# Patient Record
Sex: Female | Born: 1995
Health system: Southern US, Community
[De-identification: ages and names within clinical notes are randomized; demographics above are authoritative.]

## PROBLEM LIST (undated history)

## (undated) DIAGNOSIS — F411 Generalized anxiety disorder: Secondary | ICD-10-CM

## (undated) DIAGNOSIS — IMO0001 Reserved for inherently not codable concepts without codable children: Secondary | ICD-10-CM

## (undated) DIAGNOSIS — R030 Elevated blood-pressure reading, without diagnosis of hypertension: Principal | ICD-10-CM

## (undated) DIAGNOSIS — F325 Major depressive disorder, single episode, in full remission: Secondary | ICD-10-CM

## (undated) HISTORY — DX: Elevated blood-pressure reading, without diagnosis of hypertension: R03.0

## (undated) HISTORY — DX: Major depressive disorder, single episode, in full remission: F32.5

## (undated) HISTORY — DX: Generalized anxiety disorder: F41.1

## (undated) HISTORY — DX: Reserved for inherently not codable concepts without codable children: IMO0001

---
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
## 2015-02-19 ENCOUNTER — Emergency Department
Admission: EM | Admit: 2015-02-19 | Discharge: 2015-02-19 | Disposition: A | Discharge status: Home / Self Care | Payer: 59 | Source: Home / Self Care | Attending: Family Medicine | Admitting: Family Medicine

## 2015-02-19 ENCOUNTER — Encounter: Payer: Self-pay | Admitting: Emergency Medicine

## 2015-02-19 DIAGNOSIS — J029 Acute pharyngitis, unspecified: Secondary | ICD-10-CM

## 2015-02-19 LAB — POCT RAPID STREP A (OFFICE): RAPID STREP A SCREEN: NEGATIVE

## 2015-02-19 MED ORDER — PENICILLIN V POTASSIUM 500 MG PO TABS: ORAL_TABLET | ORAL | Status: DC

## 2015-02-19 NOTE — ED Provider Notes (Signed)
CSN: [id number]     Arrival date & time 02/19/15  1732 History   First MD Initiated Contact with Patient 02/19/15 1746     Chief Complaint  Patient presents with  . Sore Throat  . Fever      HPI Comments: Patient developed a sore throat five days ago that has persisted.  She has had fever as high as 100.3 last night.  No URI symptoms other than occasional sneezing.   History reviewed. No pertinent past medical history. History reviewed. No pertinent past surgical history. History reviewed. No pertinent family history. History  Substance Use Topics  . Smoking status: Never Smoker   . Smokeless tobacco: Not on file  . Alcohol Use: No   OB History    No data available     Review of Systems + sore throat No cough + sneezing No pleuritic pain No wheezing No nasal congestion ? post-nasal drainage No sinus pain/pressure No itchy/red eyes No earache No hemoptysis No SOB + fever, + chills No nausea No vomiting No abdominal pain No diarrhea No urinary symptoms No skin rash + fatigue + myalgias + headache Used OTC meds without relief  Allergies  Review of patient's allergies indicates no known allergies.  Home Medications   Prior to Admission medications   Medication Sig Start Date End Date Taking? Authorizing Provider  etonogestrel (NEXPLANON) 68 MG IMPL implant 1 each by Subdermal route once.   Yes Historical Provider, MD  fluticasone (FLONASE) 50 MCG/ACT nasal spray Place 2 sprays into both nostrils daily. 02/07/14   Shon Baton, MD  penicillin v potassium (VEETID) 500 MG tablet Take one tab by mouth twice daily for 10 days 02/19/15   Lattie Haw, MD   BP 123/85 mmHg  Pulse 115  Temp(Src) 99.1 F (37.3 C) (Oral)  Resp 18  Ht  (1.626 m)  Wt 202 lb (91.627 kg)  BMI 34.66 kg/m2  SpO2 98% Physical Exam Nursing notes and Vital Signs reviewed. Appearance:  Patient appears stated age, and in no acute distress Eyes:  Pupils are equal, round, and  reactive to light and accomodation.  Extraocular movement is intact.  Conjunctivae are not inflamed  Ears:  Canals normal.  Tympanic membranes normal.  Nose:  Mildly congested turbinates.  No sinus tenderness.   Pharynx:  Erythematous and slightly swollen without obstruction. Bilateral exudate present. Neck:  Supple.  Tender enlarged anterior nodes are palpated bilaterally  Lungs:  Clear to auscultation.  Breath sounds are equal.  Moving air well. Heart:  Regular rate and rhythm without murmurs, rubs, or gallops.  Abdomen:  Nontender without masses or hepatosplenomegaly.  Bowel sounds are present.  No CVA or flank tenderness.  Extremities:  No edema.  No calf tenderness Skin:  No rash present.   ED Course  Procedures  None   Labs Reviewed  STREP A DNA PROBE  POCT RAPID STREP A (OFFICE) negative         MDM   1. Acute pharyngitis, unspecified pharyngitis type; suspect strep pharyngitis despite negative rapid strep test.    Throat culture pending Begin PenVK  BID for 10 days for presumptive strep Try warm salt water gargles for sore throat.  May take Ibuprofen , 4 tabs every 8 hours with food.  Followup with Family Doctor if not improved in 10 days.    Lattie Haw, MD 02/19/15 [id number]

## 2015-02-19 NOTE — Discharge Instructions (Signed)
Try warm salt water gargles for sore throat.  May take Ibuprofen 200mg, 4 tabs every 8 hours with food.  ° ° °Salt Water Gargle °This solution will help make your mouth and throat feel better. °HOME CARE INSTRUCTIONS  °· Mix 1 teaspoon of salt in 8 ounces of warm water. °· Gargle with this solution as much or often as you need or as directed. Swish and gargle gently if you have any sores or wounds in your mouth. °· Do not swallow this mixture. °Document Released: 04/19/2004 Document Revised: 10/08/2011 Document Reviewed: 09/10/2008 °[employer]® Patient Information ©2015 [employer]. This information is not intended to replace advice given to you by your health care provider. Make sure you discuss any questions you have with your health care provider. ° °

## 2015-02-19 NOTE — ED Notes (Signed)
Gives 5 day history of progressively worse sore throat with low grade fever; took OTC at 1030 today; sister being treated for probable Strep.

## 2015-02-21 LAB — STREP A DNA PROBE: GASP: NEGATIVE

## 2015-02-23 ENCOUNTER — Telehealth: Payer: Self-pay | Admitting: *Deleted

## 2015-09-01 DIAGNOSIS — J069 Acute upper respiratory infection, unspecified: Secondary | ICD-10-CM | POA: Diagnosis not present

## 2015-09-01 DIAGNOSIS — J0100 Acute maxillary sinusitis, unspecified: Secondary | ICD-10-CM | POA: Diagnosis not present

## 2015-11-02 DIAGNOSIS — Z0100 Encounter for examination of eyes and vision without abnormal findings: Secondary | ICD-10-CM | POA: Diagnosis not present

## 2015-11-02 DIAGNOSIS — Z0110 Encounter for examination of ears and hearing without abnormal findings: Secondary | ICD-10-CM | POA: Diagnosis not present

## 2015-11-02 DIAGNOSIS — F411 Generalized anxiety disorder: Secondary | ICD-10-CM | POA: Diagnosis not present

## 2015-11-02 DIAGNOSIS — Z0000 Encounter for general adult medical examination without abnormal findings: Secondary | ICD-10-CM | POA: Diagnosis not present

## 2016-01-09 ENCOUNTER — Encounter: Payer: Self-pay | Admitting: Family Medicine

## 2016-01-09 ENCOUNTER — Ambulatory Visit (INDEPENDENT_AMBULATORY_CARE_PROVIDER_SITE_OTHER): Payer: 59 | Admitting: Family Medicine

## 2016-01-09 VITALS — BP 144/79 | HR 75 | Wt 212.0 lb

## 2016-01-09 DIAGNOSIS — F411 Generalized anxiety disorder: Secondary | ICD-10-CM | POA: Diagnosis not present

## 2016-01-09 HISTORY — DX: Generalized anxiety disorder: F41.1

## 2016-01-09 MED ORDER — SERTRALINE HCL 25 MG PO TABS: 25.0000 mg | ORAL_TABLET | Freq: Every day | ORAL | Status: DC

## 2016-01-09 NOTE — Progress Notes (Signed)
       Barbara Hooper is a 20 y.o. female who [name] [facility] today for establish care and discuss anxiety. Patient has had difficulty with anxiety for her entire life. However recently her anxiety has worsened. 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 She notes worsening anxiety and difficulty falling asleep due to rumination thoughts. She's failed some classes because of her anxiety issues. She's never had medications for this problem before. She notes a family history significant for anxiety. She denies any personal or family history for bipolar disorder.   History reviewed. No pertinent past medical history. History reviewed. No pertinent past surgical history. Social History  Substance Use Topics  . Smoking status: Never Smoker   . Smokeless tobacco: Not on file  . Alcohol Use: No   family history includes Cancer in her maternal aunt and maternal uncle; Depression in her maternal grandmother; Hypertension in her father and mother.  ROS as above: No headache, visual changes, nausea, vomiting, diarrhea, constipation, dizziness, abdominal pain, skin rash, fevers, chills, night sweats, weight loss, swollen lymph nodes, body aches, joint swelling, muscle aches, chest pain, shortness of breath, , visual or auditory hallucinations.    Medications: Current Outpatient Prescriptions  Medication Sig Dispense Refill  . etonogestrel (NEXPLANON) 68 MG IMPL implant 1 each by Subdermal route once.    . sertraline (ZOLOFT) 25 MG tablet Take 1 tablet (25 mg total) by mouth daily. 30 tablet 0   No current facility-administered medications for this visit.   No Known Allergies   Exam:  BP 144/79 mmHg  Pulse 75  Wt 212 lb (96.163 kg) Gen: Well NAD HEENT: EOMI,  MMM Lungs: Normal work of breathing. CTABL Heart: RRR no  MRG Abd: NABS, Soft. Nondistended, Nontender Exts: Brisk capillary refill, warm and well perfused.  Psych: Alert and oriented normal speech thought process and affect.  GAD 7 is 11 PHQ9 is 16 MDQ is positive at 9  No results found for this or any previous visit (from the past 24 hour(s)). No results found.    Assessment and Plan: 20 y.o. female with  Anxiety disorder. Likely generalized anxiety disorder. Doubtful for bipolar and she denies any significant manic symptoms. When asked in more detail most of the positive mood disorder questionnaire are more related to anxiety and less than mania. Additionally she denies any family history for bipolar disorder. Plan for trial of Zoloft and recheck in 2 weeks. Additionally refer to therapy/counseling.  Discussed warning signs or symptoms. Please see discharge instructions. Patient expresses understanding.

## 2016-01-09 NOTE — Patient Instructions (Signed)
Thank you for coming in today. Start 25 mg of Zoloft daily. After 1 week increase to 50 mg (2 pills) daily. Return for recheck in about 2 weeks. You should hear from psychology counseling soon/   Generalized Anxiety Disorder Generalized anxiety disorder (GAD) is a mental disorder. It interferes with life functions, including relationships, work, and school. GAD is different from normal anxiety, which everyone experiences at some point in their lives in response to specific life events and activities. Normal anxiety actually helps us prepare for and get through these life events and activities. Normal anxiety goes away after the event or activity is over.  GAD causes anxiety that is not necessarily related to specific events or activities. It also causes excess anxiety in proportion to specific events or activities. The anxiety associated with GAD is also difficult to control. GAD can vary from mild to severe. People with severe GAD can have intense waves of anxiety with physical symptoms (panic attacks).  SYMPTOMS The anxiety and worry associated with GAD are difficult to control. This anxiety and worry are related to many life events and activities and also occur more days than not for 6 months or longer. People with GAD also have three or more of the following symptoms (one or more in children):  Restlessness.   Fatigue.  Difficulty concentrating.   Irritability.  Muscle tension.  Difficulty sleeping or unsatisfying sleep. DIAGNOSIS GAD is diagnosed through an assessment by your health care provider. Your health care provider will ask you questions aboutyour mood,physical symptoms, and events in your life. Your health care provider may ask you about your medical history and use of alcohol or drugs, including prescription medicines. Your health care provider may also do a physical exam and blood tests. Certain medical conditions and the use of certain substances can cause symptoms  similar to those associated with GAD. Your health care provider may refer you to a mental health specialist for further evaluation. TREATMENT The following therapies are usually used to treat GAD:   Medication. Antidepressant medication usually is prescribed for long-term daily control. Antianxiety medicines may be added in severe cases, especially when panic attacks occur.   Talk therapy (psychotherapy). Certain types of talk therapy can be helpful in treating GAD by providing support, education, and guidance. A form of talk therapy called cognitive behavioral therapy can teach you healthy ways to think about and react to daily life events and activities.  Stress managementtechniques. These include yoga, meditation, and exercise and can be very helpful when they are practiced regularly. A mental health specialist can help determine which treatment is best for you. Some people see improvement with one therapy. However, other people require a combination of therapies.   This information is not intended to replace advice given to you by your health care provider. Make sure you discuss any questions you have with your health care provider.   Document Released: 11/10/2012 Document Revised: 08/06/2014 Document Reviewed: 11/10/2012 Elsevier Interactive Patient Education [employer]2016 [employer] Inc.

## 2016-01-19 ENCOUNTER — Encounter: Payer: Self-pay | Admitting: Family Medicine

## 2016-01-23 ENCOUNTER — Ambulatory Visit (INDEPENDENT_AMBULATORY_CARE_PROVIDER_SITE_OTHER): Payer: 59 | Admitting: Psychology

## 2016-01-23 ENCOUNTER — Ambulatory Visit (INDEPENDENT_AMBULATORY_CARE_PROVIDER_SITE_OTHER): Payer: 59 | Admitting: Family Medicine

## 2016-01-23 ENCOUNTER — Encounter: Payer: Self-pay | Admitting: Family Medicine

## 2016-01-23 VITALS — BP 148/70 | HR 73 | Wt 212.0 lb

## 2016-01-23 DIAGNOSIS — F411 Generalized anxiety disorder: Secondary | ICD-10-CM

## 2016-01-23 DIAGNOSIS — R030 Elevated blood-pressure reading, without diagnosis of hypertension: Secondary | ICD-10-CM

## 2016-01-23 DIAGNOSIS — IMO0001 Reserved for inherently not codable concepts without codable children: Secondary | ICD-10-CM

## 2016-01-23 MED ORDER — SERTRALINE HCL 100 MG PO TABS: 100.0000 mg | ORAL_TABLET | Freq: Every day | ORAL | Status: DC

## 2016-01-23 MED FILL — SERTRALINE HCL 100 MG TAB: 100 | 90 days supply | Qty: 90 | Fill #0

## 2016-01-23 NOTE — Progress Notes (Signed)
       Barbara Hooper is a 20 y.o. female who [facility]: Primary Care Sports Medicine today for follow-up anxiety and depression. Patient was seen 2 weeks ago diagnosed with anxiety and depression and started on Zoloft. She started at 25 mg daily increasing to 50 mg daily last week. She feels much better and is sleeping much better. No significant GI upset. No fevers or chills. She tolerates the medication well. Patient has started counseling as well. She had her first session today and felt it went well.   No past medical history on file. No past surgical history on file. Social History  Substance Use Topics  . Smoking status: Never Smoker   . Smokeless tobacco: Not on file  . Alcohol Use: No   family history includes Cancer in her maternal aunt and maternal uncle; Depression in her maternal grandmother; Hypertension in her father and mother.  ROS as above:  Medications: Current Outpatient Prescriptions  Medication Sig Dispense Refill  . etonogestrel (NEXPLANON) 68 MG IMPL implant 1 each by Subdermal route once.    . sertraline (ZOLOFT) 100 MG tablet Take 1 tablet (100 mg total) by mouth daily. 90 tablet 0   No current facility-administered medications for this visit.   No Known Allergies   Exam:  BP 148/70 mmHg  Pulse 73  Wt 212 lb (96.163 kg) Gen: Well NAD HEENT: EOMI,  MMM Lungs: Normal work of breathing. CTABL Heart: RRR no MRG Abd: NABS, Soft. Nondistended, Nontender Exts: Brisk capillary refill, warm and well perfused.  Psych: Alert and oriented normal speech thought process and affect.  [facility] 01/23/2016  Decreased Interest 0  Down, Depressed, Hopeless 1  PHQ - 2 Score 1  Altered sleeping 0  Tired, decreased energy 1  Change in appetite 0  Feeling bad or failure about yourself  1  Trouble concentrating 1  Moving slowly or fidgety/restless 0    Suicidal thoughts 0  PHQ-9 Score 4  Difficult doing work/chores Somewhat difficult    GAD 7 is 6  No results found for this or any previous visit (from the past 24 hour(s)). No results found.    Assessment and Plan: 20 y.o. female with  1) depression: Much improved on Zoloft. Increase to 100 mg daily. Recheck in one month. Continue counseling/therapy. 2) elevated blood pressure: New Issue addressed today Continues to be mildly elevated. Recheck in 1 month if still elevated will start medication. Obtain fasting labs today.  Discussed warning signs or symptoms. Please see discharge instructions. Patient expresses understanding.

## 2016-01-23 NOTE — Patient Instructions (Signed)
Thank you for coming in today. Increase Zoloft to 100 mg daily Get fasting labs soon Return in one month

## 2016-01-30 ENCOUNTER — Ambulatory Visit (INDEPENDENT_AMBULATORY_CARE_PROVIDER_SITE_OTHER): Payer: 59 | Admitting: Psychology

## 2016-01-30 DIAGNOSIS — F411 Generalized anxiety disorder: Secondary | ICD-10-CM

## 2016-02-13 DIAGNOSIS — R030 Elevated blood-pressure reading, without diagnosis of hypertension: Secondary | ICD-10-CM | POA: Diagnosis not present

## 2016-02-14 LAB — TSH: TSH: 2.79 mIU/L

## 2016-02-14 LAB — COMPREHENSIVE METABOLIC PANEL
ALK PHOS: 102 U/L (ref 33–115)
ALT: 19 U/L (ref 6–29)
AST: 19 U/L (ref 10–30)
Albumin: 4.4 g/dL (ref 3.6–5.1)
BILIRUBIN TOTAL: 0.6 mg/dL (ref 0.2–1.2)
BUN: 8 mg/dL (ref 7–25)
CALCIUM: 9.4 mg/dL (ref 8.6–10.2)
CO2: 27 mmol/L (ref 20–31)
Chloride: 103 mmol/L (ref 98–110)
Creat: 0.59 mg/dL (ref 0.50–1.10)
Glucose, Bld: 85 mg/dL (ref 65–99)
POTASSIUM: 4.2 mmol/L (ref 3.5–5.3)
Sodium: 140 mmol/L (ref 135–146)
TOTAL PROTEIN: 7 g/dL (ref 6.1–8.1)

## 2016-02-14 LAB — CBC
HEMATOCRIT: 41.4 % (ref 35.0–45.0)
HEMOGLOBIN: 13.5 g/dL (ref 11.7–15.5)
MCH: 28.8 pg (ref 27.0–33.0)
MCHC: 32.6 g/dL (ref 32.0–36.0)
MCV: 88.3 fL (ref 80.0–100.0)
MPV: 12.5 fL (ref 7.5–12.5)
Platelets: 229 10*3/uL (ref 140–400)
RBC: 4.69 MIL/uL (ref 3.80–5.10)
RDW: 13.6 % (ref 11.0–15.0)
WBC: 7.4 10*3/uL (ref 3.8–10.8)

## 2016-02-14 LAB — LIPID PANEL
CHOLESTEROL: 193 mg/dL — AB (ref 125–170)
HDL: 53 mg/dL (ref >=46)
LDL CALC: 128 mg/dL — AB (ref <110)
Total CHOL/HDL Ratio: 3.6 Ratio (ref <=5.0)
Triglycerides: 59 mg/dL (ref <150)
VLDL: 12 mg/dL (ref <30)

## 2016-02-14 LAB — HEMOGLOBIN A1C
HEMOGLOBIN A1C: 5.4 % (ref <5.7)
Mean Plasma Glucose: 108 mg/dL

## 2016-02-14 NOTE — Progress Notes (Signed)
Quick Note:  Labs look ok ______ 

## 2016-02-17 ENCOUNTER — Ambulatory Visit (INDEPENDENT_AMBULATORY_CARE_PROVIDER_SITE_OTHER): Payer: 59 | Admitting: Psychology

## 2016-02-17 DIAGNOSIS — F411 Generalized anxiety disorder: Secondary | ICD-10-CM | POA: Diagnosis not present

## 2016-02-20 ENCOUNTER — Encounter: Payer: Self-pay | Admitting: Family Medicine

## 2016-02-20 ENCOUNTER — Ambulatory Visit (INDEPENDENT_AMBULATORY_CARE_PROVIDER_SITE_OTHER): Payer: 59 | Admitting: Family Medicine

## 2016-02-20 VITALS — BP 136/83 | HR 76 | Wt 210.0 lb

## 2016-02-20 DIAGNOSIS — F411 Generalized anxiety disorder: Secondary | ICD-10-CM

## 2016-02-20 DIAGNOSIS — R030 Elevated blood-pressure reading, without diagnosis of hypertension: Secondary | ICD-10-CM | POA: Diagnosis not present

## 2016-02-20 DIAGNOSIS — IMO0001 Reserved for inherently not codable concepts without codable children: Secondary | ICD-10-CM

## 2016-02-20 NOTE — Progress Notes (Signed)
       Barbara Hooper is a 20 y.o. female who presents to [facility]: [facility] today for follow-up anxiety depression and hypertension.  Patient was seen a few weeks ago for anxiety and depression. She had improved on Zoloft. She notes she is taking 100 mg daily and feels well. She denies any SI or HI. She still has some anxiety symptoms but is receiving counseling and notes these are improving. She notes some difficulty sleeping and started taking Zoloft in the morning which seems to help.  Hypertension: Patient has had a history of elevated blood pressures. She notes she gets quite anxious to the doctor's office. No chest pain palpitations shortness of breath.   No past medical history on file. No past surgical history on file. Social History  Substance Use Topics  . Smoking status: Never Smoker  . Smokeless tobacco: Not on file  . Alcohol use No   family history includes Cancer in her maternal aunt and maternal uncle; Depression in her maternal grandmother; Hypertension in her father and mother.  ROS as above:  Medications: Current Outpatient Prescriptions  Medication Sig Dispense Refill  . etonogestrel (NEXPLANON) 68 MG IMPL implant 1 each by Subdermal route once.    . sertraline (ZOLOFT) 100 MG tablet Take 1 tablet (100 mg total) by mouth daily. 90 tablet 0   No current facility-administered medications for this visit.    No Known Allergies   Exam:  BP 136/83   Pulse 76   Wt 210 lb (95.3 kg)   BMI 36.05 kg/m  Gen: Well NAD HEENT: EOMI,  MMM Lungs: Normal work of breathing. CTABL Heart: RRR no MRG Abd: NABS, Soft. Nondistended, Nontender Exts: Brisk capillary refill, warm and well perfused.   Depression screen [facility] 2/9 02/20/2016 01/23/2016  Decreased Interest 1 0  Down, Depressed, Hopeless 0 1  PHQ - 2 Score 1 1  Altered sleeping 2 0  Tired, decreased  energy 0 1  Change in appetite 1 0  Feeling bad or failure about yourself  1 1  Trouble concentrating 2 1  Moving slowly or fidgety/restless 0 0  Suicidal thoughts 0 0  PHQ-9 Score 7 4  Difficult doing work/chores - Somewhat difficult    GAD 7 : Generalized Anxiety Score 02/20/2016  Nervous, Anxious, on Edge 1  Control/stop worrying 1  Worry too much - different things 2  Trouble relaxing 1  Restless 1  Easily annoyed or irritable 1  Afraid - awful might happen 0  Total GAD 7 Score 7  Anxiety Difficulty Somewhat difficult      No results found for this or any previous visit (from the past 24 hour(s)). No results found.    Assessment and Plan: 20 y.o. female with improving anxiety and depression symptoms. Continue Zoloft and counseling. Recheck in 2 months  Elevated blood pressure: Still high today. Patient will start checking home blood pressures keep a log and return in 2 months with a blood pressure log and her device.  Discussed warning signs or symptoms. Please see discharge instructions. Patient expresses understanding.

## 2016-02-20 NOTE — Patient Instructions (Signed)
Thank you for coming in today. Return in 2 months.  Get an over the counter blood pressure cuff and keep a log.  Bring the log and the device to a recheck visit in about 2 months.  Call for refills of zoloft.

## 2016-02-26 ENCOUNTER — Other Ambulatory Visit: Payer: Self-pay | Admitting: Family Medicine

## 2016-03-12 ENCOUNTER — Ambulatory Visit (INDEPENDENT_AMBULATORY_CARE_PROVIDER_SITE_OTHER): Payer: 59 | Admitting: Psychology

## 2016-03-12 DIAGNOSIS — F411 Generalized anxiety disorder: Secondary | ICD-10-CM

## 2016-03-30 ENCOUNTER — Ambulatory Visit (INDEPENDENT_AMBULATORY_CARE_PROVIDER_SITE_OTHER): Payer: 59 | Admitting: Psychology

## 2016-03-30 DIAGNOSIS — F411 Generalized anxiety disorder: Secondary | ICD-10-CM

## 2016-04-18 ENCOUNTER — Ambulatory Visit (INDEPENDENT_AMBULATORY_CARE_PROVIDER_SITE_OTHER): Payer: 59 | Admitting: Psychology

## 2016-04-18 DIAGNOSIS — F411 Generalized anxiety disorder: Secondary | ICD-10-CM | POA: Diagnosis not present

## 2016-04-23 ENCOUNTER — Ambulatory Visit (INDEPENDENT_AMBULATORY_CARE_PROVIDER_SITE_OTHER): Payer: 59 | Admitting: Family Medicine

## 2016-04-23 ENCOUNTER — Encounter: Payer: Self-pay | Admitting: Family Medicine

## 2016-04-23 VITALS — BP 120/75 | HR 76 | Wt 215.0 lb

## 2016-04-23 DIAGNOSIS — F411 Generalized anxiety disorder: Secondary | ICD-10-CM

## 2016-04-23 DIAGNOSIS — Z23 Encounter for immunization: Secondary | ICD-10-CM

## 2016-04-23 MED ORDER — BUPROPION HCL ER (XL) 150 MG PO TB24: 150.0000 mg | ORAL_TABLET | ORAL | 0 refills | Status: DC

## 2016-04-23 NOTE — Progress Notes (Signed)
Barbara Hooper is a 20 y.o. female who presents to [name] today for follow up of anxiety/depression and elevated blood pressure.  Anxiety/depression:  She has been taking Zoloft 100 mg daily and attending counseling every 2 weeks.  Patient states the counseling helps her considerably.  Her anxiety symptoms have improved, but she has noticed worsening depression since her last visit.  She endorses intermittent feelings of self harm for the past few weeks, though she has not developed a plan.  She denies current SI.  Patient reports increased fatigue throughout the day and difficulty sleeping at night.  She feels fatigued even after sleeping well at night.  Denies manic symptoms.  Elevated blood pressure:  Home blood pressure log reveals her systolic pressure ranges from 161-096E.  She has been working out more since her last visit.  No exertional chest pain, shortness of breath, or palpitations.     Past Medical History:  Diagnosis Date  . Elevated blood pressure    No past surgical history on file. Social History  Substance Use Topics  . Smoking status: Never Smoker  . Smokeless tobacco: Not on file  . Alcohol use No   family history includes Cancer in her maternal aunt and maternal uncle; Depression in her maternal grandmother; Hypertension in her father and mother.  ROS as above:  Medications: Current Outpatient Prescriptions  Medication Sig Dispense Refill  . etonogestrel (NEXPLANON) 68 MG IMPL implant 1 each by Subdermal route once.    . sertraline (ZOLOFT) 100 MG tablet Take 1 tablet (100 mg total) by mouth daily. 90 tablet 0   No current facility-administered medications for this visit.    No Known Allergies   Exam:  BP 120/75   Pulse 76   Wt 215 lb (97.5 kg)   BMI 36.90 kg/m  Gen: Well NAD Lungs: Normal work of breathing. CTABL Heart: RRR  no MRG Exts: Brisk capillary refill, warm and well perfused.  Psych: Normal mood and affect.  Normal mentation  Depression screen [facility] 2/9 04/23/2016 02/20/2016 01/23/2016  Decreased Interest 1 1 0  Down, Depressed, Hopeless 1 0 1  PHQ - 2 Score 2 1 1   Altered sleeping 3 2 0  Tired, decreased energy 2 0 1  Change in appetite 2 1 0  Feeling bad or failure about yourself  1 1 1   Trouble concentrating 1 2 1   Moving slowly or fidgety/restless 0 0 0  Suicidal thoughts 2 0 0  PHQ-9 Score 13 7 4   Difficult doing work/chores - - Somewhat difficult    GAD 7 : Generalized Anxiety Score 04/23/2016 02/20/2016  Nervous, Anxious, on Edge 2 1  Control/stop worrying 1 1  Worry too much - different things 1 2  Trouble relaxing 1 1  Restless 1 1  Easily annoyed or irritable 3 1  Afraid - awful might happen 1 0  Total GAD 7 Score 10 7  Anxiety Difficulty Somewhat difficult Somewhat difficult      No results found for this or any previous visit (from the past 24 hour(s)). No results found.    Assessment and Plan: 20 y.o. female with:  1. Anxiety/depression:  Worsening since last visit with intermittent feelings of self harm.  No current SI or HI.   - Wellbutrin 150 mg daily - Continue Zoloft 100 mg daily - Continue sleep hygiene - Follow up in 2 weeks Established problem worsened  2.  Elevated  blood pressure:  Normal blood pressure readings at home. BP 120/75 in clinic.  Her previous elevations in blood pressure taken in clinic were likely related to white coat hypertension.  We will recheck at next visit.    3.  Health maintenance:  Flu shot given   No orders of the defined types were placed in this encounter.   Discussed warning signs or symptoms. Please see discharge instructions. Patient expresses understanding.  I was present for and Independently participated in the interview and physical exam and edited and agree with the note.  Clementeen GrahamEvan Corey, MD

## 2016-04-23 NOTE — Patient Instructions (Signed)
Thank you for coming in today. Add Wellbutrin.  Return in 2 weeks.  Work on Physiological scientist.   Insomnia Insomnia is a sleep disorder that makes it difficult to fall asleep or to stay asleep. Insomnia can cause tiredness (fatigue), low energy, difficulty concentrating, mood swings, and poor performance at work or school.  There are three different ways to classify insomnia:  Difficulty falling asleep.  Difficulty staying asleep.  Waking up too early in the morning. Any type of insomnia can be long-term (chronic) or short-term (acute). Both are common. Short-term insomnia usually lasts for three months or less. Chronic insomnia occurs at least three times a week for longer than three months. CAUSES  Insomnia may be caused by another condition, situation, or substance, such as:  Anxiety.  Certain medicines.  Gastroesophageal reflux disease (GERD) or other gastrointestinal conditions.  Asthma or other breathing conditions.  Restless legs syndrome, sleep apnea, or other sleep disorders.  Chronic pain.  Menopause. This may include hot flashes.  Stroke.  Abuse of alcohol, tobacco, or illegal drugs.  Depression.  Caffeine.   Neurological disorders, such as Alzheimer disease.  An overactive thyroid (hyperthyroidism). The cause of insomnia may not be known. RISK FACTORS Risk factors for insomnia include:  Gender. Women are more commonly affected than men.  Age. Insomnia is more common as you get older.  Stress. This may involve your professional or personal life.  Income. Insomnia is more common in people with lower income.  Lack of exercise.   Irregular work schedule or night shifts.  Traveling between different time zones. SIGNS AND SYMPTOMS If you have insomnia, trouble falling asleep or trouble staying asleep is the main symptom. This may lead to other symptoms, such as:  Feeling fatigued.  Feeling nervous about going to sleep.  Not feeling rested in the  morning.  Having trouble concentrating.  Feeling irritable, anxious, or depressed. TREATMENT  Treatment for insomnia depends on the cause. If your insomnia is caused by an underlying condition, treatment will focus on addressing the condition. Treatment may also include:   Medicines to help you sleep.  Counseling or therapy.  Lifestyle adjustments. HOME CARE INSTRUCTIONS   Take medicines only as directed by your health care provider.  Keep regular sleeping and waking hours. Avoid naps.  Keep a sleep diary to help you and your health care provider figure out what could be causing your insomnia. Include:   When you sleep.  When you wake up during the night.  How well you sleep.   How rested you feel the next day.  Any side effects of medicines you are taking.  What you eat and drink.   Make your bedroom a comfortable place where it is easy to fall asleep:  Put up shades or special blackout curtains to block light from outside.  Use a white noise machine to block noise.  Keep the temperature cool.   Exercise regularly as directed by your health care provider. Avoid exercising right before bedtime.  Use relaxation techniques to manage stress. Ask your health care provider to suggest some techniques that may work well for you. These may include:  Breathing exercises.  Routines to release muscle tension.  Visualizing peaceful scenes.  Cut back on alcohol, caffeinated beverages, and cigarettes, especially close to bedtime. These can disrupt your sleep.  Do not overeat or eat spicy foods right before bedtime. This can lead to digestive discomfort that can make it hard for you to sleep.  Limit screen use  before bedtime. This includes:  Watching TV.  Using your smartphone, tablet, and computer.  Stick to a routine. This can help you fall asleep faster. Try to do a quiet activity, brush your teeth, and go to bed at the same time each night.  Get out of bed if  you are still awake after 15 minutes of trying to sleep. Keep the lights down, but try reading or doing a quiet activity. When you feel sleepy, go back to bed.  Make sure that you drive carefully. Avoid driving if you feel very sleepy.  Keep all follow-up appointments as directed by your health care provider. This is important. SEEK MEDICAL CARE IF:   You are tired throughout the day or have trouble in your daily routine due to sleepiness.  You continue to have sleep problems or your sleep problems get worse. SEEK IMMEDIATE MEDICAL CARE IF:   You have serious thoughts about hurting yourself or someone else.   This information is not intended to replace advice given to you by your health care provider. Make sure you discuss any questions you have with your health care provider.   Document Released: 07/13/2000 Document Revised: 04/06/2015 Document Reviewed: 04/16/2014 Elsevier Interactive Patient Education [employer]2016 Elsevier Inc.

## 2016-05-04 ENCOUNTER — Ambulatory Visit (INDEPENDENT_AMBULATORY_CARE_PROVIDER_SITE_OTHER): Payer: 59 | Admitting: Psychology

## 2016-05-04 DIAGNOSIS — F411 Generalized anxiety disorder: Secondary | ICD-10-CM | POA: Diagnosis not present

## 2016-05-11 ENCOUNTER — Ambulatory Visit (INDEPENDENT_AMBULATORY_CARE_PROVIDER_SITE_OTHER): Payer: 59 | Admitting: Family Medicine

## 2016-05-11 ENCOUNTER — Encounter: Payer: Self-pay | Admitting: Family Medicine

## 2016-05-11 VITALS — BP 123/66 | HR 67 | Wt 210.0 lb

## 2016-05-11 DIAGNOSIS — F329 Major depressive disorder, single episode, unspecified: Secondary | ICD-10-CM | POA: Insufficient documentation

## 2016-05-11 DIAGNOSIS — F325 Major depressive disorder, single episode, in full remission: Secondary | ICD-10-CM | POA: Diagnosis not present

## 2016-05-11 DIAGNOSIS — F411 Generalized anxiety disorder: Secondary | ICD-10-CM | POA: Diagnosis not present

## 2016-05-11 HISTORY — DX: Major depressive disorder, single episode, in full remission: F32.5

## 2016-05-11 MED ORDER — SERTRALINE HCL 100 MG PO TABS: 100.0000 mg | ORAL_TABLET | Freq: Every day | ORAL | 3 refills | Status: DC

## 2016-05-11 MED ORDER — BUPROPION HCL ER (XL) 150 MG PO TB24: 150.0000 mg | ORAL_TABLET | ORAL | 3 refills | Status: DC

## 2016-05-11 MED FILL — SERTRALINE HCL 100 MG TAB: 100 | 90 days supply | Qty: 90 | Fill #0

## 2016-05-11 NOTE — Progress Notes (Signed)
       Barbara Hooper is a 20 y.o. female [name] today for follow-up anxiety and depression. At last visit patient complained of insomnia and had some depression symptoms.  She was was currently taking Zoloft and was given albuterol in addition. Since then she feels great and is sleeping much better.   Past Medical History:  Diagnosis Date  . Elevated blood pressure   . GAD (generalized anxiety disorder) 01/09/2016  . Major depression in complete remission (HCC) 05/11/2016   No past surgical history on file. Social History  Substance Use Topics  . Smoking status: Never Smoker  . Smokeless tobacco: Not on file  . Alcohol use No   family history includes Cancer in her maternal aunt and maternal uncle; Depression in her maternal grandmother; Hypertension in her father and mother.  ROS as above:  Medications: Current Outpatient Prescriptions  Medication Sig Dispense Refill  . buPROPion (WELLBUTRIN XL) 150 MG 24 hr tablet Take 1 tablet (150 mg total) by mouth every morning. 90 tablet 3  . etonogestrel (NEXPLANON) 68 MG IMPL implant 1 each by Subdermal route once.    . sertraline (ZOLOFT) 100 MG tablet Take 1 tablet (100 mg total) by mouth daily. 90 tablet 3   No current facility-administered medications for this visit.    No Known Allergies  Health Maintenance Health Maintenance  Topic Date Due  . HIV Screening  11/26/2010  . TETANUS/TDAP  11/26/2014  . INFLUENZA VACCINE  Completed     Exam:  BP 123/66   Pulse 67   Wt 210 lb (95.3 kg)   BMI 36.05 kg/m  Gen: Well NAD  Psych: Alert and oriented normal speech thought process and affect. [facility] 05/11/2016 04/23/2016 02/20/2016 01/23/2016  Decreased Interest 0 1 1 0  Down, Depressed, Hopeless 1 1 0 1  PHQ - 2 Score 1 2 1 1   Altered sleeping 1 3 2  0  Tired, decreased  energy 1 2 0 1  Change in appetite 0 2 1 0  Feeling bad or failure about yourself  1 1 1 1   Trouble concentrating 0 1 2 1   Moving slowly or fidgety/restless 0 0 0 0  Suicidal thoughts 0 2 0 0  PHQ-9 Score 4 13 7 4   Difficult doing work/chores - - - Somewhat difficult   GAD 7 : Generalized Anxiety Score 05/11/2016 04/23/2016 02/20/2016  Nervous, Anxious, on Edge 1 2 1   Control/stop worrying 0 1 1  Worry too much - different things 1 1 2   Trouble relaxing 1 1 1   Restless 0 1 1  Easily annoyed or irritable 0 3 1  Afraid - awful might happen 0 1 0  Total GAD 7 Score 3 10 7   Anxiety Difficulty Not difficult at all Somewhat difficult Somewhat difficult       No results found for this or any previous visit (from the past 72 hour(s)). No results found.    Assessment and Plan: 20 y.o. female with depression and anxiety. Continue current regimen. Recheck in 6-12 months. Recommend Pap smear after birthday in April.   No orders of the defined types were placed in this encounter.   Discussed warning signs or symptoms. Please see discharge instructions. Patient expresses understanding.

## 2016-05-11 NOTE — Patient Instructions (Signed)
Thank you for coming in today. Continue medicines.  Return in 1 year or sooner if needed.  We should consider Pap smear after your birthday in April.

## 2016-05-16 ENCOUNTER — Ambulatory Visit: Payer: 59 | Admitting: Psychology

## 2016-05-21 MED FILL — BUPROPION HCL XL 150 MG TAB: 150 | 90 days supply | Qty: 90 | Fill #0

## 2016-06-01 ENCOUNTER — Ambulatory Visit (INDEPENDENT_AMBULATORY_CARE_PROVIDER_SITE_OTHER): Payer: 59 | Admitting: Psychology

## 2016-06-01 DIAGNOSIS — F411 Generalized anxiety disorder: Secondary | ICD-10-CM | POA: Diagnosis not present

## 2016-06-29 ENCOUNTER — Ambulatory Visit (INDEPENDENT_AMBULATORY_CARE_PROVIDER_SITE_OTHER): Payer: 59 | Admitting: Psychology

## 2016-06-29 DIAGNOSIS — F411 Generalized anxiety disorder: Secondary | ICD-10-CM

## 2016-07-20 ENCOUNTER — Ambulatory Visit: Payer: 59 | Admitting: Psychology

## 2016-08-07 ENCOUNTER — Ambulatory Visit (INDEPENDENT_AMBULATORY_CARE_PROVIDER_SITE_OTHER): Payer: 59 | Admitting: Psychology

## 2016-08-07 DIAGNOSIS — F411 Generalized anxiety disorder: Secondary | ICD-10-CM

## 2016-08-22 ENCOUNTER — Ambulatory Visit (INDEPENDENT_AMBULATORY_CARE_PROVIDER_SITE_OTHER): Payer: 59 | Admitting: Psychology

## 2016-08-22 DIAGNOSIS — F411 Generalized anxiety disorder: Secondary | ICD-10-CM

## 2016-09-11 ENCOUNTER — Ambulatory Visit (INDEPENDENT_AMBULATORY_CARE_PROVIDER_SITE_OTHER): Payer: 59 | Admitting: Psychology

## 2016-09-11 DIAGNOSIS — F411 Generalized anxiety disorder: Secondary | ICD-10-CM | POA: Diagnosis not present

## 2016-09-25 ENCOUNTER — Ambulatory Visit (INDEPENDENT_AMBULATORY_CARE_PROVIDER_SITE_OTHER): Payer: 59 | Admitting: Psychology

## 2016-09-25 DIAGNOSIS — F411 Generalized anxiety disorder: Secondary | ICD-10-CM | POA: Diagnosis not present

## 2016-10-09 ENCOUNTER — Ambulatory Visit (INDEPENDENT_AMBULATORY_CARE_PROVIDER_SITE_OTHER): Payer: 59 | Admitting: Psychology

## 2016-10-09 DIAGNOSIS — F411 Generalized anxiety disorder: Secondary | ICD-10-CM

## 2016-10-23 ENCOUNTER — Ambulatory Visit (INDEPENDENT_AMBULATORY_CARE_PROVIDER_SITE_OTHER): Payer: 59 | Admitting: Psychology

## 2016-10-23 DIAGNOSIS — F411 Generalized anxiety disorder: Secondary | ICD-10-CM | POA: Diagnosis not present

## 2016-10-29 DIAGNOSIS — H5203 Hypermetropia, bilateral: Secondary | ICD-10-CM | POA: Diagnosis not present

## 2016-10-29 DIAGNOSIS — H52223 Regular astigmatism, bilateral: Secondary | ICD-10-CM | POA: Diagnosis not present

## 2016-11-06 ENCOUNTER — Ambulatory Visit (INDEPENDENT_AMBULATORY_CARE_PROVIDER_SITE_OTHER): Payer: 59 | Admitting: Psychology

## 2016-11-06 DIAGNOSIS — F411 Generalized anxiety disorder: Secondary | ICD-10-CM | POA: Diagnosis not present

## 2016-12-07 ENCOUNTER — Ambulatory Visit (INDEPENDENT_AMBULATORY_CARE_PROVIDER_SITE_OTHER): Payer: 59 | Admitting: Psychology

## 2016-12-07 DIAGNOSIS — F411 Generalized anxiety disorder: Secondary | ICD-10-CM | POA: Diagnosis not present

## 2016-12-16 ENCOUNTER — Telehealth: Payer: 59 | Admitting: Family

## 2016-12-16 DIAGNOSIS — N390 Urinary tract infection, site not specified: Secondary | ICD-10-CM | POA: Diagnosis not present

## 2016-12-16 MED ORDER — CEPHALEXIN 500 MG PO CAPS: 500.0000 mg | ORAL_CAPSULE | Freq: Two times a day (BID) | ORAL | 0 refills | Status: DC

## 2016-12-16 NOTE — Progress Notes (Signed)

## 2016-12-21 ENCOUNTER — Ambulatory Visit: Payer: 59 | Admitting: Psychology

## 2017-01-04 ENCOUNTER — Ambulatory Visit: Payer: 59 | Admitting: Psychology

## 2017-03-19 ENCOUNTER — Ambulatory Visit (INDEPENDENT_AMBULATORY_CARE_PROVIDER_SITE_OTHER): Payer: 59 | Admitting: Family Medicine

## 2017-03-19 VITALS — BP 128/73 | HR 103 | Temp 98.8°F | Wt 221.0 lb

## 2017-03-19 DIAGNOSIS — J069 Acute upper respiratory infection, unspecified: Secondary | ICD-10-CM | POA: Diagnosis not present

## 2017-03-19 DIAGNOSIS — F411 Generalized anxiety disorder: Secondary | ICD-10-CM

## 2017-03-19 DIAGNOSIS — G43909 Migraine, unspecified, not intractable, without status migrainosus: Secondary | ICD-10-CM

## 2017-03-19 DIAGNOSIS — F325 Major depressive disorder, single episode, in full remission: Secondary | ICD-10-CM

## 2017-03-19 MED ORDER — PREDNISONE 10 MG PO TABS: 30.0000 mg | ORAL_TABLET | Freq: Every day | ORAL | 0 refills | Status: DC

## 2017-03-19 MED ORDER — SUMATRIPTAN SUCCINATE 50 MG PO TABS: 50.0000 mg | ORAL_TABLET | ORAL | 0 refills | Status: DC | PRN

## 2017-03-19 MED FILL — predniSONE 10 MG TABS: 10 | 5 days supply | Qty: 15 | Fill #0

## 2017-03-19 MED FILL — SUMATRIPTAN SUCC 50 MG TABL: 50 | 30 days supply | Qty: 9 | Fill #0

## 2017-03-19 NOTE — Progress Notes (Signed)
Barbara Hooper is a 21 y.o. female who presents to [facility]: [facility] today for cough congestion and runny nose.  Symptoms present now for about a week. Additionally patient notes a frequent headache. She describes these headaches as pounding and bilateral consistent with previous episodes of migraine. She does not typically have migraines very frequently. She's tried some over-the-counter medications which have helped. She denies any sick contacts. Symptoms are mild to moderate.  Anxiety and depression: Patient notes significant improvement of anxiety and depression symptoms with therapy. She no longer is taking medications.   Past Medical History:  Diagnosis Date  . Elevated blood pressure   . GAD (generalized anxiety disorder) 01/09/2016  . Major depression in complete remission (HCC) 05/11/2016   No past surgical history on file. Social History  Substance Use Topics  . Smoking status: Never Smoker  . Smokeless tobacco: Not on file  . Alcohol use No   family history includes Cancer in her maternal aunt and maternal uncle; Depression in her maternal grandmother; Hypertension in her father and mother.  ROS as above:  Medications: Current Outpatient Prescriptions  Medication Sig Dispense Refill  . etonogestrel (NEXPLANON) 68 MG IMPL implant 1 each by Subdermal route once.    . predniSONE (DELTASONE) 10 MG tablet Take 3 tablets (30 mg total) by mouth daily with breakfast. 15 tablet 0  . SUMAtriptan (IMITREX) 50 MG tablet Take 1 tablet (50 mg total) by mouth every 2 (two) hours as needed for migraine. May repeat in 2 hours if headache persists or recurs. 30 tablet 0   No current facility-administered medications for this visit.    No Known Allergies  Health Maintenance Health Maintenance  Topic Date Due  . HIV Screening  11/26/2010  . PAP SMEAR  11/25/2016  .  TETANUS/TDAP  03/18/2017  . INFLUENZA VACCINE  03/30/2018 (Originally 02/27/2017)     Exam:  BP 128/73   Pulse (!) 103   Temp 98.8 F (37.1 C)   Wt 221 lb (100.2 kg)   BMI 37.93 kg/m   Gen: Well NAD HEENT: EOMI,  MMM clear nasal discharge Posterior pharynx with cobblestoning. Cervical lymph nodes are mildly swollen. Maxillary and frontal sinuses are nontender. Tympanic membranes bilaterally. Lungs: Normal work of breathing. CTABL Heart: RRR no MRG Abd: NABS, Soft. Nondistended, Nontender Exts: Brisk capillary refill, warm and well perfused.  Psych alert and oriented normal speech thought process and affect.  Depression screen [facility] 2/9 03/19/2017 05/11/2016 04/23/2016 02/20/2016 01/23/2016  Decreased Interest 1 0 1 1 0  Down, Depressed, Hopeless 1 1 1  0 1  PHQ - 2 Score 2 1 2 1 1   Altered sleeping 3 1 3 2  0  Tired, decreased energy 1 1 2  0 1  Change in appetite 1 0 2 1 0  Feeling bad or failure about yourself  0 1 1 1 1   Trouble concentrating 0 0 1 2 1   Moving slowly or fidgety/restless 0 0 0 0 0  Suicidal thoughts 0 0 2 0 0  PHQ-9 Score 7 4 13 7 4   Difficult doing work/chores Somewhat difficult - - - Somewhat difficult      No results found for this or any previous visit (from the past 72 hour(s)). No results found.    Assessment and Plan: 21 y.o. female with  Viral URI with mild sinusitis. Likely exacerbating existing migraine disorder. Discussed options. Plan to treat with prednisone course as well  as sumatriptan for headaches. Recheck if not improving.  Anxiety and depression resolved. Plan for watchful waiting.   No orders of the defined types were placed in this encounter.  Meds ordered this encounter  Medications  . SUMAtriptan (IMITREX) 50 MG tablet    Sig: Take 1 tablet (50 mg total) by mouth every 2 (two) hours as needed for migraine. May repeat in 2 hours if headache persists or recurs.    Dispense:  30 tablet    Refill:  0  . predniSONE (DELTASONE) 10 MG  tablet    Sig: Take 3 tablets (30 mg total) by mouth daily with breakfast.    Dispense:  15 tablet    Refill:  0     Discussed warning signs or symptoms. Please see discharge instructions. Patient expresses understanding.

## 2017-03-19 NOTE — Patient Instructions (Signed)
Thank you for coming in today. Take Imitrex for migraine headache as needed.  Take prednisone for 5 days.  Recheck as needed.    Migraine Headache A migraine headache is a very strong throbbing pain on one side or both sides of your head. Migraines can also cause other symptoms. Talk with your doctor about what things may bring on (trigger) your migraine headaches. Follow these instructions at home: Medicines  Take over-the-counter and prescription medicines only as told by your doctor.  Do not drive or use heavy machinery while taking prescription pain medicine.  To prevent or treat constipation while you are taking prescription pain medicine, your doctor may recommend that you: ? Drink enough fluid to keep your pee (urine) clear or pale yellow. ? Take over-the-counter or prescription medicines. ? Eat foods that are high in fiber. These include fresh fruits and vegetables, whole grains, and beans. ? Limit foods that are high in fat and processed sugars. These include fried and sweet foods. Lifestyle  Avoid alcohol.  Do not use any products that contain nicotine or tobacco, such as cigarettes and e-cigarettes. If you need help quitting, ask your doctor.  Get at least 8 hours of sleep every night.  Limit your stress. General instructions   Keep a journal to find out what may bring on your migraines. For example, write down: ? What you eat and drink. ? How much sleep you get. ? Any change in what you eat or drink. ? Any change in your medicines.  If you have a migraine: ? Avoid things that make your symptoms worse, such as bright lights. ? It may help to lie down in a dark, quiet room. ? Do not drive or use heavy machinery. ? Ask your doctor what activities are safe for you.  Keep all follow-up visits as told by your doctor. This is important. Contact a doctor if:  You get a migraine that is different or worse than your usual migraines. Get help right away if:  Your  migraine gets very bad.  You have a fever.  You have a stiff neck.  You have trouble seeing.  Your muscles feel weak or like you cannot control them.  You start to lose your balance a lot.  You start to have trouble walking.  You pass out (faint). This information is not intended to replace advice given to you by your health care provider. Make sure you discuss any questions you have with your health care provider. Document Released: 04/24/2008 Document Revised: 02/03/2016 Document Reviewed: 01/02/2016 Elsevier Interactive Patient Education  2017 [employer]  Sumatriptan tablets What is this medicine? SUMATRIPTAN (soo ma TRIP tan) is used to treat migraines with or without aura. An aura is a strange feeling or visual disturbance that warns you of an attack. It is not used to prevent migraines. This medicine may be used for other purposes; ask your health care provider or pharmacist if you have questions. COMMON BRAND NAME(S): Imitrex, Migraine Pack What should I tell my health care provider before I take this medicine? They need to know if you have any of these conditions: -circulation problems in fingers and toes -diabetes -heart disease -high blood pressure -high cholesterol -history of irregular heartbeat -history of stroke -kidney disease -liver disease -postmenopausal or surgical removal of uterus and ovaries -seizures -smoke tobacco -stomach or intestine problems -an unusual or allergic reaction to sumatriptan, other medicines, foods, dyes, or preservatives -pregnant or trying to get pregnant -breast-feeding How should I use  this medicine? Take this medicine by mouth with a glass of water. Follow the directions on the prescription label. This medicine is taken at the first symptoms of a migraine. It is not for everyday use. If your migraine headache returns after one dose, you can take another dose as directed. You must leave at least 2 hours between doses, and do  not take more than 100 mg as a single dose. Do not take more than 200 mg total in any 24 hour period. If there is no improvement at all after the first dose, do not take a second dose without talking to your doctor or health care professional. Do not take your medicine more often than directed. Talk to your pediatrician regarding the use of this medicine in children. Special care may be needed. Overdosage: If you think you have taken too much of this medicine contact a poison control center or emergency room at once. NOTE: This medicine is only for you. Do not share this medicine with others. What if I miss a dose? This does not apply; this medicine is not for regular use. What may interact with this medicine? Do not take this medicine with any of the following medicines: -cocaine -ergot alkaloids like dihydroergotamine, ergonovine, ergotamine, methylergonovine -feverfew -MAOIs like Carbex, Eldepryl, Marplan, Nardil, and Parnate -other medicines for migraine headache like almotriptan, eletriptan, frovatriptan, naratriptan, rizatriptan, zolmitriptan -tryptophan This medicine may also interact with the following medications: -certain medicines for depression, anxiety, or psychotic disturbances This list may not describe all possible interactions. Give your health care provider a list of all the medicines, herbs, non-prescription drugs, or dietary supplements you use. Also tell them if you smoke, drink alcohol, or use illegal drugs. Some items may interact with your medicine. What should I watch for while using this medicine? Only take this medicine for a migraine headache. Take it if you get warning symptoms or at the start of a migraine attack. It is not for regular use to prevent migraine attacks. You may get drowsy or dizzy. Do not drive, use machinery, or do anything that needs mental alertness until you know how this medicine affects you. To reduce dizzy or fainting spells, do not sit or stand  up quickly, especially if you are an older patient. Alcohol can increase drowsiness, dizziness and flushing. Avoid alcoholic drinks. Smoking cigarettes may increase the risk of heart-related side effects from using this medicine. If you take migraine medicines for 10 or more days a month, your migraines may get worse. Keep a diary of headache days and medicine use. Contact your healthcare professional if your migraine attacks occur more frequently. What side effects may I notice from receiving this medicine? Side effects that you should report to your doctor or health care professional as soon as possible: -allergic reactions like skin rash, itching or hives, swelling of the face, lips, or tongue -bloody or watery diarrhea -hallucination, loss of contact with reality -pain, tingling, numbness in the face, hands, or feet -seizures -signs and symptoms of a blood clot such as breathing problems; changes in vision; chest pain; severe, sudden headache; pain, swelling, warmth in the leg; trouble speaking; sudden numbness or weakness of the face, arm, or leg -signs and symptoms of a dangerous change in heartbeat or heart rhythm like chest pain; dizziness; fast or irregular heartbeat; palpitations, feeling faint or lightheaded; falls; breathing problems -signs and symptoms of a stroke like changes in vision; confusion; trouble speaking or understanding; severe headaches; sudden numbness or weakness of  the face, arm, or leg; trouble walking; dizziness; loss of balance or coordination -stomach pain Side effects that usually do not require medical attention (report to your doctor or health care professional if they continue or are bothersome): -changes in taste -facial flushing -headache -muscle cramps -muscle pain -nausea, vomiting -weak or tired This list may not describe all possible side effects. Call your doctor for medical advice about side effects. You may report side effects to FDA at  1-800-FDA-1088. Where should I keep my medicine? Keep out of the reach of children. Store at room temperature between 2 and 30 degrees C (36 and 86 degrees F). Throw away any unused medicine after the expiration date. NOTE: This sheet is a summary. It may not cover all possible information. If you have questions about this medicine, talk to your doctor, pharmacist, or health care provider.  2018 Elsevier/Gold Standard (2015-08-18 12:38:23)

## 2017-05-21 ENCOUNTER — Encounter: Payer: Self-pay | Admitting: Physician Assistant

## 2017-05-21 ENCOUNTER — Ambulatory Visit (INDEPENDENT_AMBULATORY_CARE_PROVIDER_SITE_OTHER): Payer: 59 | Admitting: Physician Assistant

## 2017-05-21 VITALS — BP 124/81 | HR 60 | Wt 220.0 lb

## 2017-05-21 DIAGNOSIS — S39012A Strain of muscle, fascia and tendon of lower back, initial encounter: Secondary | ICD-10-CM

## 2017-05-21 MED ORDER — CYCLOBENZAPRINE HCL 10 MG PO TABS: 5.0000 mg | ORAL_TABLET | Freq: Every evening | ORAL | 0 refills | Status: DC | PRN

## 2017-05-21 MED ORDER — PREDNISONE 50 MG PO TABS: ORAL_TABLET | ORAL | 0 refills | Status: DC

## 2017-05-21 MED FILL — CYCLOBENZAPRINE 10 MG TAB: 10 | 30 days supply | Qty: 30 | Fill #0

## 2017-05-21 MED FILL — predniSONE 50 MG TABS: 50 | 5 days supply | Qty: 5 | Fill #0

## 2017-05-21 NOTE — Patient Instructions (Addendum)
- Prednisone daily for 5 days. Once you complete steroid okay to switch to Ibuprofen 800 mg every 8 hours - Ice or heat, whichever feels better, 3-4 times daily x 20 minutes - Flexeril at bedtime - No bending, stooping, lifting for 1 week, then gradual return to activities as tolerating - Formal physical therapy - Follow-up with Dr. Denyse Amassorey in 2 weeks  Low Back Sprain A sprain is a stretch or tear in the bands of tissue that hold bones and joints together (ligaments). Sprains of the lower back (lumbar spine) are a common cause of low back pain. A sprain occurs when ligaments are overextended or stretched beyond their limits. The ligaments can become inflamed, resulting in pain and sudden muscle tightening (spasms). A sprain can be caused by an injury (trauma), or it can develop gradually due to overuse. There are three types of sprains:  Grade 1 is a mild sprain involving an overstretched ligament or a very slight tear of the ligament.  Grade 2 is a moderate sprain involving a partial tear of the ligament.  Grade 3 is a severe sprain involving a complete tear of the ligament.  What are the causes? This condition may be caused by:  Trauma, such as a fall or a hit to the body.  Twisting or overstretching the back. This may result from doing activities that require a lot of energy, such as lifting heavy objects.  What increases the risk? The following factors may increase your risk of getting this condition:  Playing contact sports.  Participating in sports or activities that put excessive stress on the back and require a lot of bending and twisting, including: ? Lifting weights or heavy objects. ? Gymnastics. ? Soccer. ? Figure skating. ? Snowboarding.  Being overweight or obese.  Having poor strength and flexibility.  What are the signs or symptoms? Symptoms of this condition may include:  Sharp or dull pain in the lower back that does not go away. Pain may extend to the  buttocks.  Stiffness.  Limited range of motion.  Inability to stand up straight due to stiffness or pain.  Muscle spasms.  How is this diagnosed?  This condition may be diagnosed based on:  Your symptoms.  Your medical history.  A physical exam. ? Your health care provider may push on certain areas of your back to determine the source of your pain. ? You may be asked to bend forward, backward, and side to side to assess the severity of your pain and your range of motion.  Imaging tests, such as: ? X-rays. ? MRI.  How is this treated? Treatment for this condition may include:  Applying heat and cold to the affected area.  Medicines to help relieve pain and to relax your muscles (muscle relaxants).  NSAIDs to help reduce swelling and discomfort.  Physical therapy.  When your symptoms improve, it is important to gradually return to your normal routine as soon as possible to reduce pain, avoid stiffness, and avoid loss of muscle strength. Generally, symptoms should improve within 6 weeks of treatment. However, recovery time varies. Follow these instructions at home: Managing pain, stiffness, and swelling  If directed, apply ice to the injured area during the first 24 hours after your injury. ? Put ice in a plastic bag. ? Place a towel between your skin and the bag. ? Leave the ice on for 20 minutes, 2-3 times a day.  If directed, apply heat to the affected area as often as told  by your health care provider. Use the heat source that your health care provider recommends, such as a moist heat pack or a heating pad. ? Place a towel between your skin and the heat source. ? Leave the heat on for 20-30 minutes. ? Remove the heat if your skin turns bright red. This is especially important if you are unable to feel pain, heat, or cold. You may have a greater risk of getting burned. Activity  Rest and return to your normal activities as told by your health care provider. Ask  your health care provider what activities are safe for you.  Avoid activities that take a lot of effort (are strenuous) for as long as told by your health care provider.  Do exercises as told by your health care provider. General instructions   Take over-the-counter and prescription medicines only as told by your health care provider.  If you have questions or concerns about safety while taking pain medicine, talk with your health care provider.  Do not drive or operate heavy machinery until you know how your pain medicine affects you.  Do not use any tobacco products, such as cigarettes, chewing tobacco, and e-cigarettes. Tobacco can delay bone healing. If you need help quitting, ask your health care provider.  Keep all follow-up visits as told by your health care provider. This is important. How is this prevented?  Warm up and stretch before being active.  Cool down and stretch after being active.  Give your body time to rest between periods of activity.  Avoid: ? Being physically inactive for long periods at a time. ? Exercising or playing sports when you are tired or in pain.  Use correct form when playing sports and lifting heavy objects.  Use good posture when sitting and standing.  Maintain a healthy weight.  Sleep on a mattress with medium firmness to support your back.  Make sure to use equipment that fits you, including shoes that fit well.  Be safe and responsible while being active to avoid falls.  Do at least 150 minutes of moderate-intensity exercise each week, such as brisk walking or water aerobics. Try a form of exercise that takes stress off your back, such as swimming or stationary cycling.  Maintain physical fitness, including: ? Strength. In particular, develop and maintain strong abdominal muscles. ? Flexibility. ? Cardiovascular fitness. ? Endurance. Contact a health care provider if:  Your back pain does not improve after 6 weeks of  treatment.  Your symptoms get worse. Get help right away if:  Your back pain is severe.  You are unable to stand or walk.  You develop pain in your legs.  You develop weakness in your buttocks or legs.  You have difficulty controlling when you urinate or when you have a bowel movement. This information is not intended to replace advice given to you by your health care provider. Make sure you discuss any questions you have with your health care provider. Document Released: 07/16/2005 Document Revised: 03/22/2016 Document Reviewed: 04/27/2015 Elsevier Interactive Patient Education  [employer].

## 2017-05-21 NOTE — Progress Notes (Signed)
HPI:                                                                Barbara Hooper is a 21 y.o. female [name] today for back pain  Patient presents today with low back pain x 2 days. 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 States she felt a "pop" in her middle low back. Pain is moderate, persistent. No constitutional symptoms, radicular symptoms, or saddle numbness. Worse with bending and rising to stand. Side sleeping and standing upright helps some. Has tried Ibuprofen and Salonpas with minimal relief.  Past Medical History:  Diagnosis Date  . Elevated blood pressure   . GAD (generalized anxiety disorder) 01/09/2016  . Major depression in complete remission (HCC) 05/11/2016   History reviewed. No pertinent surgical history. Social History  Substance Use Topics  . Smoking status: Never Smoker  . Smokeless tobacco: Never Used  . Alcohol use No   family history includes Cancer in her maternal aunt and maternal uncle; Depression in her maternal grandmother; Hypertension in her father and mother.  ROS: negative except as noted in the HPI  Medications: Current Outpatient Prescriptions  Medication Sig Dispense Refill  . etonogestrel (NEXPLANON) 68 MG IMPL implant 1 each by Subdermal route once.    . SUMAtriptan (IMITREX) 50 MG tablet Take 1 tablet (50 mg total) by mouth every 2 (two) hours as needed for migraine. May repeat in 2 hours if headache persists or recurs. 30 tablet 0   No current facility-administered medications for this visit.    No Known Allergies     Objective:  BP 124/81   Pulse 60   Wt 220 lb (99.8 kg)   BMI 37.76 kg/m  Gen:  alert, not ill-appearing, no distress, appropriate for age HEENT: head normocephalic without obvious abnormality, conjunctiva and cornea clear, trachea midline Pulm: Normal work of breathing, normal phonation, clear to auscultation  bilaterally, no wheezes, rales or rhonchi CV: Normal rate, regular rhythm, s1 and s2 distinct, no murmurs, clicks or rubs  Neuro: alert and oriented x 3, no tremor MSK: back atraumatic, tenderness over the lumbosacral area, ROM limited with flexion, normal external rotation, extension and lateral bend, negative straight leg raise bilaterally, extremities atraumatic, strength 5/5 and symmetric in bilateral lower extremities, normal gait and station Skin: intact, no rashes on exposed skin, no jaundice, no cyanosis   No results found for this or any previous visit (from the past 72 hour(s)). No results found.    Assessment and Plan: 21 y.o. female with   1. Acute myofascial strain of lumbar region, initial encounter - Prednisone daily for 5 days. Once you complete steroid okay to switch to Ibuprofen 800 mg every 8 hours - Ice or heat, whichever feels better, 3-4 times daily x 20 minutes - Flexeril at bedtime - No bending, stooping, lifting for 1 week, then gradual return to activities as tolerating - Formal physical therapy - Follow-up with Dr. Denyse Amassorey in 2 weeks  - predniSONE (DELTASONE) 50 MG tablet; One tab PO daily for 5 days.  Dispense: 5 tablet; Refill: 0 - cyclobenzaprine (FLEXERIL) 10 MG tablet; Take 0.5-1 tablets (5-10 mg total) by  mouth at bedtime as needed for muscle spasms.  Dispense: 30 tablet; Refill: 0 - Ambulatory referral to Physical Therapy   Patient education and anticipatory guidance given Patient agrees with treatment plan Follow-up with Sports in 2 weeks or sooner as needed if symptoms worsen or fail to improve  Levonne Hubert PA-C

## 2017-05-24 ENCOUNTER — Ambulatory Visit (INDEPENDENT_AMBULATORY_CARE_PROVIDER_SITE_OTHER): Payer: 59 | Admitting: Rehabilitative and Restorative Service Providers"

## 2017-05-24 ENCOUNTER — Encounter: Payer: Self-pay | Admitting: Rehabilitative and Restorative Service Providers"

## 2017-05-24 DIAGNOSIS — M545 Low back pain, unspecified: Secondary | ICD-10-CM

## 2017-05-24 DIAGNOSIS — R29898 Other symptoms and signs involving the musculoskeletal system: Secondary | ICD-10-CM | POA: Diagnosis not present

## 2017-05-24 NOTE — Therapy (Signed)
[facility] [address] [place], [place], [address] Phone: [phone]   Fax:  [phone]  Physical Therapy Evaluation  Patient Details  Name: Barbara Hooper MRN: [id number] Date of Birth: Dec 18, 1995 Referring Provider: Gena Frayharley Cummings, PA-C  Encounter Date: 05/24/2017      PT End of Session - 05/24/17 0847    Visit Number 1   Number of Visits 12   Date for PT Re-Evaluation 07/05/17   PT Start Time 0846   PT Stop Time 0945   PT Time Calculation (min) 59 min   Activity Tolerance Patient tolerated treatment well      Past Medical History:  Diagnosis Date  . Elevated blood pressure   . GAD (generalized anxiety disorder) 01/09/2016  . Major depression in complete remission (HCC) 05/11/2016    History reviewed. No pertinent surgical history.  There were no vitals filed for this visit.       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 She was seen by PA 05/21/17 and was diagnosed with muscle strain. She has been treated witn medication and limited activity and reports a "tiny bit" of improvement.   Pertinent History denies any medical or musculoskeletal problems or injuries    How long can you sit comfortably? 1-2 hours    How long can you stand comfortably? 30-60 min    How long can you walk comfortably? 20-30 min    Patient Stated Goals get rid of LBP; strengthen back to avoid future injuries     Currently in Pain? Yes   Pain Score 5    Pain Location Back   Pain Orientation Lower;Right;Left   Pain Descriptors / Indicators Tender   Pain Type Acute pain   Pain Radiating Towards into the sacrum    Pain Onset In the past 7 days   Pain Frequency Constant   Aggravating Factors  bending; turning in bed; transitional movements in and out of chair/bed; crossing legs     Pain Relieving Factors meds(muscle relaxants); heat; ice; heat patches            [facility] PT Assessment - 05/24/17 0001      Assessment   Medical Diagnosis Lumbar strain    Referring Provider Gena Frayharley Cummings, PA-C   Onset Date/Surgical Date 05/20/17   Hand Dominance Right   Next MD Visit 06/04/17   Prior Therapy none     Precautions   Precautions None     Balance Screen   Has the patient fallen in the past 6 months No   Has the patient had a decrease in activity level because of a fear of falling?  No   Is the patient reluctant to leave their home because of a fear of falling?  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 Patient has not been to gym since injury - mostly machines and some running on treadmill      Observation/Other Assessments   Focus on Therapeutic Outcomes (FOTO)  47%  limitation      Sensation   Additional Comments WFL's per pt report      Posture/Postural Control   Posture Comments head forward shoulders rounded; increased thoracic kyphosis; flexed forward at hips      AROM   AROM Assessment Site --  pain with flexion; min with extension    Lumbar Flexion 35%   Lumbar Extension 85%   Lumbar - Right Side Bend 80%   Lumbar - Left Side Bend 80%   Lumbar - Right Rotation 75%   Lumbar - Left Rotation 75%     Strength   Overall Strength --  poor core contraction    Overall Strength Comments 5/5 bilat LE's      Flexibility   Hamstrings 75 deg bilat   Quadriceps tight bilat    ITB WFL's    Piriformis WFL's      Palpation   Spinal mobility pain with CPA mobs L5/S1 and less thorough L4/L5   Palpation comment mild tightness through bilat lumbar paraspinals              Objective measurements completed on examination: See above findings.          [facility] Adult PT Treatment/Exercise - 05/24/17 0001      Self-Care   Self-Care --  initiated back care education     Lumbar Exercises: Stretches   Passive Hamstring Stretch 2 reps;30 seconds  supine with strap    Quad Stretch 2 reps;30 seconds  prone with strap      Lumbar Exercises: Supine   Dead Bug 10 reps  core engaged   Other Supine Lumbar Exercises 4 part core 10 sec x 10      Moist Heat Therapy   Number Minutes Moist Heat 20 Minutes   Moist Heat Location Lumbar Spine     Electrical Stimulation   Electrical Stimulation Location bilat lower lumbar   Electrical Stimulation Action IFC   Electrical Stimulation Parameters to tolerance   Electrical Stimulation Goals Pain;Tone                PT Education - 05/24/17 [phone]    Education provided Yes   Education Details HEP TENS    Person(s) Educated Patient   Methods Explanation;Demonstration;Tactile cues;Verbal cues;Handout   Comprehension Verbalized understanding;Returned demonstration;Verbal cues required;Tactile cues required             PT Long Term Goals - 05/24/17 0943      PT LONG TERM GOAL #1   Title Improve core stability allowing patient to perform exercise and work activities without pain or discomoft 07/05/17   Time 6   Period Weeks   Status New     PT LONG TERM GOAL #2   Title Improve lumbar ROM to WNL's and pain free 07/05/17   Time 6   Period Weeks   Status New     PT LONG TERM GOAL #3   Title Decrease pain by 75-100% allowing patient to return to all normal functional activities 07/05/17   Time 6   Period Weeks   Status New     PT LONG TERM GOAL #4   Title Independent in HEP and gym program 07/05/17   Time 6   Period Weeks   Status New     PT LONG TERM GOAL #5   Title Improve FOTO to </= 20% limitation 07/05/17   Time 6   Period Weeks   Status New  Plan -  05/24/17 0934    Clinical Impression Statement [name] presents with LBP following lifting injury 05/20/17. She has pain with trunk motions; weak core; palpation through the lumbar spine/musculature; pain with transitional activities and prolonged positions/activities. Patient will benefit from PT to address problems identified and improve core stability to help in prevention of further injuries.    Clinical Presentation Stable   Clinical Decision Making Low   Rehab Potential Good   PT Frequency 2x / week   PT Duration 6 weeks   PT Treatment/Interventions Patient/family education;ADLs/Self Care Home Management;Cryotherapy;Electrical Stimulation;Iontophoresis 4mg /ml Dexamethasone;Moist Heat;Ultrasound;Dry needling;Manual techniques;Therapeutic activities;Therapeutic exercise;Neuromuscular re-education   PT Next Visit Plan review HEP; instruct in standing without hypersexention of knees; progress core stabilization; modalities as indicated    PT Home Exercise Plan work with patient on appropriate gym program prior to d/c    Consulted and Agree with Plan of Care Patient      Patient will benefit from skilled therapeutic intervention in order to improve the following deficits and impairments:  Postural dysfunction, Improper body mechanics, Pain, Decreased mobility, Decreased activity tolerance  Visit Diagnosis: Acute bilateral low back pain without sciatica - Plan: PT plan of care cert/re-cert  Other symptoms and signs involving the musculoskeletal system - Plan: PT plan of care cert/re-cert     Problem List Patient Active Problem List   Diagnosis Date Noted  . Acute lumbar myofascial strain 05/21/2017  . Major depression in complete remission (HCC) 05/11/2016  . GAD (generalized anxiety disorder) 01/09/2016    Pete Merten Rober Minion PT, MPH  05/24/2017, 9:55 AM  [facility] [address] [place], [place], [address] Phone: [phone]    Fax:  [phone]  Name: JUDINE ARCINIEGA MRN: [id number] Date of Birth: 11-Aug-1995

## 2017-05-24 NOTE — Patient Instructions (Addendum)
HIP: Hamstrings - Supine  Place strap around foot. Raise leg up, keeping knee straight.  Bend opposite knee to protect back if indicated. Hold 30 seconds. 3 reps per set, 2-3 sets per day   Quads / HF, Prone KNEE: Quadriceps - Prone    Place strap around ankle. Bring ankle toward buttocks. Press hip into surface. Hold 30 seconds. Repeat 3 times per session. Do 2-3 sessions per day.  Abdominal Bracing With Pelvic Floor (Hook-Lying)    With neutral spine, tighten pelvic floor and abdominals sucking belly button to back bone; tighten muscles in the low back at waist, hold, slowly exhale  Hold 10 sec Repeat _10__ times. Do _several__ times a day. Progress to do this in sitting; standing; walking and with functional activities.     Combination (Hook-Lying)    Tighten stomach and slowly raise left leg and lower opposite arm over head. Keep core tight. Repeat __10__ times per set. Do __1-2__ sets per session. Do __1-2__ sessions per day.   TENS UNIT: This is helpful for muscle pain and spasm.   Search and Purchase a TENS 7000 2nd edition at www.tenspros.com. It should be less than $30.     TENS unit instructions: Do not shower or bathe with the unit on Turn the unit off before removing electrodes or batteries If the electrodes lose stickiness add a drop of water to the electrodes after they are disconnected from the unit and place on plastic sheet. If you continued to have difficulty, call the TENS unit company to purchase more electrodes. Do not apply lotion on the skin area prior to use. Make sure the skin is clean and dry as this will help prolong the life of the electrodes. After use, always check skin for unusual red areas, rash or other skin difficulties. If there are any skin problems, does not apply electrodes to the same area. Never remove the electrodes from the unit by pulling the wires. Do not use the TENS unit or electrodes other than as directed. Do not change  electrode placement without consultating your therapist or physician. Keep 2 fingers with between each electrode.

## 2017-05-29 ENCOUNTER — Ambulatory Visit (INDEPENDENT_AMBULATORY_CARE_PROVIDER_SITE_OTHER): Payer: 59 | Admitting: Physical Therapy

## 2017-05-29 DIAGNOSIS — R29898 Other symptoms and signs involving the musculoskeletal system: Secondary | ICD-10-CM

## 2017-05-29 DIAGNOSIS — M545 Low back pain, unspecified: Secondary | ICD-10-CM

## 2017-05-29 NOTE — Patient Instructions (Signed)
CORE ENGAGED:  Knee to Chest: Transverse Plane Stability   Bring one knee up, then return. Be sure pelvis does not roll side to side. Keep pelvis still. Lift knee __10_ times each leg. Restabilize pelvis. Repeat with other leg. Do _1-2__ sets, _1-2__ times per day.  Hip External Rotation With Pillow: Transverse Plane Stability   KEEP BOTH KNEES BENT.  Slowly roll bent knee out. Be sure pelvis does not rotate. Do _10__ times. Restabilize pelvis. Repeat with other leg. Do _1-2__ sets, _1__ times per day.  Piriformis Stretch    Lying on back,bend BOTH knees.  Place ankle on opposite thigh.  Pull knee toward opposite shoulder. Hold __30__ seconds. Repeat _2___ times. Do __2__ sessions per day.  Quadriceps (Stand)    Stand holding onto stationary object. Grasp right ankle. Pull knee back. Keep back straight. Do not rotate or arch back. Hold ___30_ seconds. Repeat _2___ times. Do _2___ sessions per day. CAUTION: Stretch should be gentle, steady and slow.  Supine to Sit: Phase 4    On back, squeeze pelvic floor and hold. Inhale. Exhale. Roll to side. Push up on elbows as feet are off bed. Sit up. Relax.    [facility] [address] [place], [place] [address]  [phone] (office) [phone] (fax)

## 2017-05-29 NOTE — Therapy (Signed)
[facility] [address] [place], [place], [address] Phone: [phone]   Fax:  [phone]  Physical Therapy Treatment  Patient Details  Name: Barbara Hooper MRN: [id number] Date of Birth: June 22, 1996 Referring Provider: Rosita Keaharlie Cummings, PA  Encounter Date: 05/29/2017      PT End of Session - 05/29/17 1106    Visit Number 2   Number of Visits 12   Date for PT Re-Evaluation 07/05/17   PT Start Time 1105   PT Stop Time 1158   PT Time Calculation (min) 53 min   Activity Tolerance Patient tolerated treatment well   Behavior During Therapy [facility] for tasks assessed/performed      Past Medical History:  Diagnosis Date  . Elevated blood pressure   . GAD (generalized anxiety disorder) 01/09/2016  . Major depression in complete remission (HCC) 05/11/2016    No past surgical history on file.  There were no vitals filed for this visit.      Subjective Assessment - 05/29/17 1106    Subjective Pt reports things have gotten a little worse since last visit.  She was babysitting 523 month old; hurt lifting baby on and off floor.     Currently in Pain? Yes   Pain Score 3    Pain Location Back   Pain Orientation Right;Lower;Left  R>L   Aggravating Factors  transitional movements, bending   Pain Relieving Factors meds, ice, analgesic pains             [facility] PT Assessment - 05/29/17 0001      Assessment   Medical Diagnosis Lumbar strain    Referring Provider Rosita Keaharlie Cummings, PA   Onset Date/Surgical Date 05/20/17   Hand Dominance Right   Next MD Visit 06/04/17          [facility] - 05/29/17 0001      Self-Care   Self-Care --     Lumbar Exercises: Stretches   Passive Hamstring Stretch 2 reps;60 seconds  supine with strap   Quad Stretch 1 rep;20 seconds  standing, with UE support   Piriformis Stretch 2 reps;30 seconds     Lumbar Exercises: Standing   Other Standing Lumbar  Exercises sit to/from supine via log roll; pt returned demo with cues x 3 reps    Other Standing Lumbar Exercises split squat x 4 reps each leg - to touch floor, simulate picking object off floor with core engaged.  deep squat with 10# pick up from black mat with core engaged x 5 reps (simulate picking up baby).  Sit to/from stand with core engaged x 5 reps     Lumbar Exercises: Supine   Ab Set 5 reps;5 seconds   AB Set Limitations biofeedback bladder under back used for visual feedback of neutral spine   Clam 10 reps  each leg with ab set   Bent Knee Raise 10 reps;2 seconds  with ab set   Dead Bug 10 reps  core engaged. tactile / VC required.      Moist Heat Therapy   Number Minutes Moist Heat 15 Minutes   Moist Heat Location Lumbar Spine     Electrical Stimulation   Electrical Stimulation Location bilat lower lumbar   Electrical Stimulation Action IFC   Electrical Stimulation Parameters to tolerance   Electrical Stimulation Goals Pain                PT Education - 05/29/17 1154    Education provided Yes  Education Details HEP    Person(s) Educated Patient   Methods Explanation;Handout;Demonstration;Tactile cues   Comprehension Verbalized understanding;Returned demonstration             PT Long Term Goals - 05/29/17 1119      PT LONG TERM GOAL #1   Title Improve core stability allowing patient to perform exercise and work activities without pain or discomoft 07/05/17   Time 6   Period Weeks   Status On-going     PT LONG TERM GOAL #2   Title Improve lumbar ROM to WNL's and pain free 07/05/17   Time 6   Period Weeks   Status On-going     PT LONG TERM GOAL #3   Title Decrease pain by 75-100% allowing patient to return to all normal functional activities 07/05/17   Time 6   Period Weeks   Status On-going     PT LONG TERM GOAL #4   Title Independent in HEP and gym program 07/05/17   Time 6   Period Weeks   Status On-going     PT LONG TERM GOAL #5    Title Improve FOTO to </= 20% limitation 07/05/17   Time 6   Period Weeks   Status On-going               Plan - 05/29/17 1158    Clinical Impression Statement Pt required some tactile/visual cues for proper engagement.  Once properly engaged, pt was able to perform functional activities (sit to/from supine) with less reported pain.  Pt progressing towards goals. Pt reported reduction of pain after MHP and estim at end of session.    Rehab Potential Good   PT Frequency 2x / week   PT Duration 6 weeks   PT Treatment/Interventions Patient/family education;ADLs/Self Care Home Management;Cryotherapy;Electrical Stimulation;Iontophoresis 4mg /ml Dexamethasone;Moist Heat;Ultrasound;Dry needling;Manual techniques;Therapeutic activities;Therapeutic exercise;Neuromuscular re-education   PT Next Visit Plan progress core stabilization; modalities as indicated    Consulted and Agree with Plan of Care Patient      Patient will benefit from skilled therapeutic intervention in order to improve the following deficits and impairments:  Postural dysfunction, Improper body mechanics, Pain, Decreased mobility, Decreased activity tolerance  Visit Diagnosis: Acute bilateral low back pain without sciatica  Other symptoms and signs involving the musculoskeletal system     Problem List Patient Active Problem List   Diagnosis Date Noted  . Acute lumbar myofascial strain 05/21/2017  . Major depression in complete remission (HCC) 05/11/2016  . GAD (generalized anxiety disorder) 01/09/2016   Mayer Camel, PTA 05/29/17 12:08 PM  [facility] [place] [address] [place], [place], [address] Phone: [phone]   Fax:  [phone]  Name: Barbara Hooper MRN: [id number] Date of Birth: 08-21-1995

## 2017-05-30 ENCOUNTER — Encounter: Payer: Self-pay | Admitting: Rehabilitative and Restorative Service Providers"

## 2017-05-30 ENCOUNTER — Ambulatory Visit (INDEPENDENT_AMBULATORY_CARE_PROVIDER_SITE_OTHER): Payer: 59 | Admitting: Rehabilitative and Restorative Service Providers"

## 2017-05-30 DIAGNOSIS — R29898 Other symptoms and signs involving the musculoskeletal system: Secondary | ICD-10-CM

## 2017-05-30 DIAGNOSIS — M545 Low back pain, unspecified: Secondary | ICD-10-CM

## 2017-05-30 NOTE — Patient Instructions (Addendum)
Bridging    Slowly raise buttocks from floor, keeping core tight. Pause 2-3 sec Repeat __10__ times per set. Do __1-3__ sets per session. Do __1__ sessions per day.   Strengthening: Hip Abductor - Resisted    With band looped around both legs above knees, core engaged, push push one knee out to side holding the opposite knee still Repeat __10__ times per set. Do __1-3__ sets per session. Do __1__ sessions per day.   Functional Quadriceps: Chair Squat    Keeping feet flat on floor, shoulder width apart, squat as low as is comfortable. Use support as necessary. Repeat ____ times per set. Do ____ sets per session. Do ____ sessions per day.     Strengthening: Hip Abduction - Resisted    With tubing around right leg, other side toward anchor, extend leg out from side. Lead out with heel  Repeat __10__ times per set. Do ___1-3_ sets per session. Do __1__ sessions per day.    Strengthening: Hip Extension - Resisted    With tubing around right ankle, face anchor and pull leg straight back. Repeat __10__ times per set. Do __1-3__ sets per session. Do ___1_ sessions per day.

## 2017-05-30 NOTE — Therapy (Signed)
[facility] [place] [address] [place], [place], [address] Phone: [phone]   Fax:  [phone]  Physical Therapy Treatment  Patient Details  Name: Barbara Hooper MRN: [id number] Date of Birth: March 07, 1996 Referring Provider: Rosita Kea, PA  Encounter Date: 05/30/2017      PT End of Session - 05/30/17 0937    Visit Number 3   Number of Visits 12   Date for PT Re-Evaluation 07/05/17   PT Start Time 0934   PT Stop Time 1032   PT Time Calculation (min) 58 min   Activity Tolerance Patient tolerated treatment well      Past Medical History:  Diagnosis Date  . Elevated blood pressure   . GAD (generalized anxiety disorder) 01/09/2016  . Major depression in complete remission (HCC) 05/11/2016    History reviewed. No pertinent surgical history.  There were no vitals filed for this visit.      Subjective Assessment - 05/30/17 0938    Subjective Felt great after she left PT yesterday. "TENS machine really helps a lot". Looked at them in [employer] yesterday but has not purchased one yet. Back "feels like it needs to pop"   Currently in Pain? Yes   Pain Score 3    Pain Location Back   Pain Orientation Right;Lower;Left   Pain Descriptors / Indicators Tender   Pain Type Acute pain   Pain Radiating Towards less pain into the sacrum    Pain Onset 1 to 4 weeks ago   Pain Frequency Constant                         [facility] Adult PT Treatment/Exercise - 05/30/17 0001      Neuro Re-ed    Neuro Re-ed Details  education to avoid hyperextension of knees with reminders and work throughout session      Lumbar Exercises: Stretches   Passive Hamstring Stretch 2 reps;60 seconds  supine with strap   Piriformis Stretch 2 reps;30 seconds     Lumbar Exercises: Standing   Functional Squats 10 reps  core engaged   Other Standing Lumbar Exercises hip abduction leading with heel x 10 each LE; hip extensino x 10 each  LE; step ups 6 inch step x 10 each LE core engaged      Lumbar Exercises: Seated   Sit to Stand 10 reps  core engaged     Lumbar Exercises: Supine   Ab Set 10 reps  10 sec hold    Clam 10 reps  green TB one LE at a time core engaged   Dead Bug 10 reps  core engaged. tactile / VC required.    Bridge 10 reps  core engaged   Other Supine Lumbar Exercises 4 part core 10 sec x 10      Moist Heat Therapy   Number Minutes Moist Heat 20 Minutes   Moist Heat Location Lumbar Spine     Electrical Stimulation   Electrical Stimulation Location bilat lower lumbar   Electrical Stimulation Action IFC   Electrical Stimulation Parameters to tolerance   Electrical Stimulation Goals Pain                PT Education - 05/30/17 0955    Education provided Yes   Education Details HEP   Person(s) Educated Patient   Methods Explanation;Demonstration;Tactile cues;Verbal cues;Handout   Comprehension Verbalized understanding;Returned demonstration;Verbal cues required;Tactile cues required  PT Long Term Goals - 05/29/17 1119      PT LONG TERM GOAL #1   Title Improve core stability allowing patient to perform exercise and work activities without pain or discomoft 07/05/17   Time 6   Period Weeks   Status On-going     PT LONG TERM GOAL #2   Title Improve lumbar ROM to WNL's and pain free 07/05/17   Time 6   Period Weeks   Status On-going     PT LONG TERM GOAL #3   Title Decrease pain by 75-100% allowing patient to return to all normal functional activities 07/05/17   Time 6   Period Weeks   Status On-going     PT LONG TERM GOAL #4   Title Independent in HEP and gym program 07/05/17   Time 6   Period Weeks   Status On-going     PT LONG TERM GOAL #5   Title Improve FOTO to </= 20% limitation 07/05/17   Time 6   Period Weeks   Status On-going               Plan - 05/30/17 0940    Clinical Impression Statement Continues to tolerate exercise well. Good  response to modalities after exercise. Requires verbal and tactile cues for proper technique for exercise. Needs continued work on core stabilization.    Rehab Potential Good   PT Frequency 2x / week   PT Duration 6 weeks   PT Treatment/Interventions Patient/family education;ADLs/Self Care Home Management;Cryotherapy;Electrical Stimulation;Iontophoresis 4mg /ml Dexamethasone;Moist Heat;Ultrasound;Dry needling;Manual techniques;Therapeutic activities;Therapeutic exercise;Neuromuscular re-education   PT Next Visit Plan progress core stabilization; modalities as indicated    PT Home Exercise Plan work with patient on appropriate gym program prior to d/c    Consulted and Agree with Plan of Care Patient      Patient will benefit from skilled therapeutic intervention in order to improve the following deficits and impairments:  Postural dysfunction, Improper body mechanics, Pain, Decreased mobility, Decreased activity tolerance  Visit Diagnosis: Acute bilateral low back pain without sciatica  Other symptoms and signs involving the musculoskeletal system     Problem List Patient Active Problem List   Diagnosis Date Noted  . Acute lumbar myofascial strain 05/21/2017  . Major depression in complete remission (HCC) 05/11/2016  . GAD (generalized anxiety disorder) 01/09/2016    Celyn Rober MinionP Holt PT, MPH  05/30/2017, 10:09 AM  [facility] [address] [place], [place], [address] Phone: [phone]   Fax:  [phone]  Name: Barbara Hooper MRN: [id number] Date of Birth: 1996-07-27

## 2017-06-04 ENCOUNTER — Ambulatory Visit (INDEPENDENT_AMBULATORY_CARE_PROVIDER_SITE_OTHER): Payer: 59 | Admitting: Family Medicine

## 2017-06-04 ENCOUNTER — Ambulatory Visit: Payer: 59 | Admitting: Physical Therapy

## 2017-06-04 ENCOUNTER — Encounter: Payer: Self-pay | Admitting: Family Medicine

## 2017-06-04 VITALS — BP 130/81 | HR 94 | Wt 219.0 lb

## 2017-06-04 DIAGNOSIS — M545 Low back pain, unspecified: Secondary | ICD-10-CM

## 2017-06-04 DIAGNOSIS — R29898 Other symptoms and signs involving the musculoskeletal system: Secondary | ICD-10-CM

## 2017-06-04 DIAGNOSIS — Z124 Encounter for screening for malignant neoplasm of cervix: Secondary | ICD-10-CM | POA: Diagnosis not present

## 2017-06-04 DIAGNOSIS — Z23 Encounter for immunization: Secondary | ICD-10-CM | POA: Diagnosis not present

## 2017-06-04 DIAGNOSIS — S39012A Strain of muscle, fascia and tendon of lower back, initial encounter: Secondary | ICD-10-CM

## 2017-06-04 NOTE — Therapy (Addendum)
[place] [address] [place], [place], [address] Phone: [phone]   Fax:  [phone]  Physical Therapy Treatment  Patient Details  Name: Barbara Hooper MRN: [id number] Date of Birth: 15-Jun-1996 Referring Provider: Sherlie Ban, [facility]   Encounter Date: 06/04/2017  PT End of Session - 06/04/17 0941    Visit Number  4    Number of Visits  12    Date for PT Re-Evaluation  07/05/17    PT Start Time  0933    PT Stop Time  1032    PT Time Calculation (min)  59 min    Activity Tolerance  Patient tolerated treatment well    Behavior During Therapy  [facility] for tasks assessed/performed       Past Medical History:  Diagnosis Date  . Elevated blood pressure   . GAD (generalized anxiety disorder) 01/09/2016  . Major depression in complete remission ([place]) 05/11/2016    No past surgical history on file.  There were no vitals filed for this visit.  Subjective Assessment - 06/04/17 0942    Subjective  Pt reports she just visited with MD; he was pleased with her progress.  she has not bought TENS unit yet, but may in the future.  Her boss has been doing the lifting the baby for now.  She has a performance 11/17.      Currently in Pain?  No/denies    Pain Score  0-No pain         [facility] PT Assessment - 06/04/17 0001      Assessment   Medical Diagnosis  Lumbar strain     Referring Provider  Sherlie Ban, [facility]    Onset Date/Surgical Date  05/20/17    Hand Dominance  Right    Next MD Visit  PRN      AROM   Lumbar Flexion  WNL    Lumbar Extension  WNL    Lumbar - Right Side Bend  WNL    Lumbar - Left Side Bend  WNL    Lumbar - Right Rotation  WNL    Lumbar - Left Rotation  WNL       [facility] Adult PT Treatment/Exercise - 06/04/17 0001      Lumbar Exercises: Stretches   Passive Hamstring Stretch  2 reps;60 seconds supine with strap   supine with strap   Piriformis Stretch  2 reps;30 seconds      Lumbar  Exercises: Aerobic   Stationary Bike  L5: 5 min       Lumbar Exercises: Standing   Heel raises   10 reps 3 sets with hands on ball against wall    3 sets with hands on ball against wall    Other Standing Lumbar Exercises  anti-rotation, band at door - in and out from core x 10 reps each side with green band.   Wood chop with green band x 10 each side      Lumbar Exercises: Supine   Other Supine Lumbar Exercises  beginner pilates 100 with legs in hooklying (sets of 20 pumps of arms x 5 sets), then oblique side crunches from hooklying position x 5 reps, 3 sets.      Lumbar Exercises: Sidelying   Other Sidelying Lumbar Exercises  modified side plank (elbow and knees bent) x 5 hip dips, then held 5 seconds - for HEP.        Modalities:      MHP to lumbar region x  15 min for reduced pain and tone.      Electric stimulation:  To bilat lumbar paraspinals, 15 min, IFC, for reduced pain and tone.     Education:  Patient issued hand out of HEP. Exercises included:   Beginner pilates 100 (as performed in session), anti rotation with green band (issued green band), 2 sets of 10, heel raises with ball over head, 2 sets of 10,  Runners stretch x 30 sec, 2 reps each leg.  Pt verbalized understanding, and returned demo.   Self care: Pt educated on safety, application, and parameters of TENS unit. Pt plans to purchase one for home use in future.  Pt verbalized understanding.     PT Long Term Goals - 06/05/17 0858      PT LONG TERM GOAL #1   Title  Improve core stability allowing patient to perform exercise and work activities without pain or discomfort 07/05/17    Time  6    Period  Weeks    Status  On-going      PT LONG TERM GOAL #2   Title  Improve lumbar ROM to WNL's and pain free 07/05/17    Time  6    Period  Weeks    Status  Achieved      PT LONG TERM GOAL #3   Title  Decrease pain by 75-100% allowing patient to return to all normal functional activities 07/05/17    Time  6    Period   Weeks    Status  Partially Met      PT LONG TERM GOAL #4   Title  Independent in HEP and gym program 07/05/17    Time  6    Period  Weeks    Status  On-going      PT LONG TERM GOAL #5   Title  Improve FOTO to </= 20% limitation 07/05/17    Time  6    Period  Weeks    Status  On-going            Plan - 06/04/17 1129    Clinical Impression Statement  Pt had had positive response to treatment, reporting less pain in back with functional activities.  She tolerated treatment with min increase in pain, mostly fatigue.  She has upcoming performance she would like to strengthen and prepare for, along with avoiding injury. HEP to address weakness has been established. Pt has met LTG#2 and is progressing well towards remaining.    Rehab Potential  Good    PT Frequency  2x / week    PT Duration  6 weeks    PT Treatment/Interventions  Patient/family education;ADLs/Self Care Home Management;Cryotherapy;Electrical Stimulation;Iontophoresis 93m/ml Dexamethasone;Moist Heat;Ultrasound;Dry needling;Manual techniques;Therapeutic activities;Therapeutic exercise;Neuromuscular re-education    PT Next Visit Plan  progress core stabilization; modalities as indicated     Consulted and Agree with Plan of Care  Patient       Patient will benefit from skilled therapeutic intervention in order to improve the following deficits and impairments:  Postural dysfunction, Improper body mechanics, Pain, Decreased mobility, Decreased activity tolerance  Visit Diagnosis: Acute bilateral low back pain without sciatica  Other symptoms and signs involving the musculoskeletal system     Problem List Patient Active Problem List   Diagnosis Date Noted  . Acute lumbar myofascial strain 05/21/2017  . [place] 05/11/2016  . GAD (generalized anxiety disorder) 01/09/2016   JKerin Perna PTA 06/05/17 9:00 AM  [facility]  [facility] [place], [place], [address] Phone: [phone]   Fax:  [phone]  Name: Barbara Hooper MRN: [id number] Date of Birth: [birth year]  PHYSICAL THERAPY DISCHARGE SUMMARY  Visits from Start of Care: 4  Current functional level related to goals / functional outcomes: See progress note for discharge status   Remaining deficits: Unknown    Education / Equipment: HEP Plan: Patient agrees to discharge.  Patient goals were partially met. Patient is being discharged due to being pleased with the current functional level.  ?????     Celyn P. Helene Kelp PT, MPH 06/12/17 7:48 AM

## 2017-06-04 NOTE — Patient Instructions (Addendum)
Thank you for coming in today. You should hear from OBGYN about pap smear appointment in the near future.  I can or OBGYN can replace your nexplanon when its due.  We will do FLU and Tdap vaccines today.    TENS UNIT: This is helpful for muscle pain and spasm.   Search and Purchase a TENS 7000 2nd edition at  www.tenspros.com or www.Amazon.com It should be less than $30.     TENS unit instructions: Do not shower or bathe with the unit on Turn the unit off before removing electrodes or batteries If the electrodes lose stickiness add a drop of water to the electrodes after they are disconnected from the unit and place on plastic sheet. If you continued to have difficulty, call the TENS unit company to purchase more electrodes. Do not apply lotion on the skin area prior to use. Make sure the skin is clean and dry as this will help prolong the life of the electrodes. After use, always check skin for unusual red areas, rash or other skin difficulties. If there are any skin problems, does not apply electrodes to the same area. Never remove the electrodes from the unit by pulling the wires. Do not use the TENS unit or electrodes other than as directed. Do not change electrode placement without consultating your therapist or physician. Keep 2 fingers with between each electrode. Wear time ratio is 2:1, on to off times.    For example on for 30 minutes off for 15 minutes and then on for 30 minutes off for 15 minutes

## 2017-06-04 NOTE — Progress Notes (Signed)
   Barbara Hooper is a 21 y.o. female who presents to [facility] today for follow-up back pain. Patient experienced acute onset of low back pain recently. She was seen by my partner who suspected lumbosacral strain and arrange for physical therapy. Barbara Hooper is doing much better pretty satisfied with her absence of back pain. She is back to work and feeling well.  Of note she is due for cervical cancer screening and would like a referral to OB/GYN for this.   Past Medical History:  Diagnosis Date  . Elevated blood pressure   . GAD (generalized anxiety disorder) 01/09/2016  . Major depression in complete remission ([facility]) 05/11/2016   No past surgical history on file. Social History   Tobacco Use  . Smoking status: Never Smoker  . Smokeless tobacco: Never Used  Substance Use Topics  . Alcohol use: No     ROS:  As above   Medications: Current Outpatient Medications  Medication Sig Dispense Refill  . cyclobenzaprine (FLEXERIL) 10 MG tablet Take 0.5-1 tablets (5-10 mg total) by mouth at bedtime as needed for muscle spasms. 30 tablet 0  . etonogestrel (NEXPLANON) 68 MG IMPL implant 1 each once by Subdermal route.    . SUMAtriptan (IMITREX) 50 MG tablet Take 1 tablet (50 mg total) by mouth every 2 (two) hours as needed for migraine. May repeat in 2 hours if headache persists or recurs. 30 tablet 0   No current facility-administered medications for this visit.    No Known Allergies   Exam:  BP 130/81   Pulse 94   Wt 219 lb (99.3 kg)   BMI 37.59 kg/m  General: Well Developed, well nourished, and in no acute distress.  Neuro/Psych: Alert and oriented x3, extra-ocular muscles intact, able to move all 4 extremities, sensation grossly intact. Skin: Warm and dry, no rashes noted.  Respiratory: Not using accessory muscles, speaking in full sentences, trachea midline.  Cardiovascular: Pulses palpable, no extremity edema. Abdomen: Does not  appear distended. MSK: L spine: Nontender to spinal midline normal back motion normal gait.    No results found for this or any previous visit (from the past 48 hour(s)). No results found.    Assessment and Plan: 21 y.o. female with lumbosacral strain. Resolved doing quite well. Plan for continued home exercise program watchful waiting and return as needed.  Influenza and Tdap vaccines were given today.  Cervical cancer screening and Nexplanon were discussed today. Patient would like a referral to OB/GYN which was arranged for today.    Orders Placed This Encounter  Procedures  . Flu Vaccine QUAD 36+ mos IM  . Tdap vaccine greater than or equal to 7yo IM  . Ambulatory referral to Obstetrics / Gynecology    Referral Priority:   Routine    Referral Type:   Consultation    Referral Reason:   Specialty Services Required    Requested Specialty:   Obstetrics and Gynecology    Number of Visits Requested:   1   Meds ordered this encounter  Medications  . etonogestrel (NEXPLANON) 68 MG IMPL implant    Sig: 1 each once by Subdermal route.    Discussed warning signs or symptoms. Please see discharge instructions. Patient expresses understanding.

## 2017-06-06 ENCOUNTER — Encounter: Payer: Self-pay | Admitting: Rehabilitative and Restorative Service Providers"

## 2017-06-11 ENCOUNTER — Encounter: Payer: Self-pay | Admitting: Rehabilitative and Restorative Service Providers"

## 2017-06-11 ENCOUNTER — Encounter: Payer: Self-pay | Admitting: Physical Therapy

## 2017-06-13 ENCOUNTER — Encounter: Payer: Self-pay | Admitting: Rehabilitative and Restorative Service Providers"

## 2017-06-18 ENCOUNTER — Encounter: Payer: Self-pay | Admitting: Physical Therapy

## 2017-06-25 ENCOUNTER — Encounter: Payer: Self-pay | Admitting: Physical Therapy

## 2017-06-27 ENCOUNTER — Encounter: Payer: Self-pay | Admitting: Physical Therapy

## 2017-09-23 DIAGNOSIS — Z113 Encounter for screening for infections with a predominantly sexual mode of transmission: Secondary | ICD-10-CM | POA: Diagnosis not present

## 2017-09-23 DIAGNOSIS — Z975 Presence of (intrauterine) contraceptive device: Secondary | ICD-10-CM | POA: Insufficient documentation

## 2017-09-23 DIAGNOSIS — Z124 Encounter for screening for malignant neoplasm of cervix: Secondary | ICD-10-CM | POA: Diagnosis not present

## 2017-09-23 DIAGNOSIS — Z30017 Encounter for initial prescription of implantable subdermal contraceptive: Secondary | ICD-10-CM | POA: Diagnosis not present

## 2017-09-23 DIAGNOSIS — Z01419 Encounter for gynecological examination (general) (routine) without abnormal findings: Secondary | ICD-10-CM | POA: Diagnosis not present

## 2017-09-23 DIAGNOSIS — Z3046 Encounter for surveillance of implantable subdermal contraceptive: Secondary | ICD-10-CM | POA: Diagnosis not present

## 2017-09-24 LAB — HM HIV SCREENING LAB: HM HIV Screening: NEGATIVE

## 2017-09-24 LAB — HM HEPATITIS C SCREENING LAB: HM Hepatitis Screen: NEGATIVE

## 2017-09-25 LAB — HM PAP SMEAR: HM Pap smear: NEGATIVE

## 2017-11-26 ENCOUNTER — Ambulatory Visit (INDEPENDENT_AMBULATORY_CARE_PROVIDER_SITE_OTHER): Payer: 59

## 2017-11-26 ENCOUNTER — Encounter: Payer: Self-pay | Admitting: Family Medicine

## 2017-11-26 ENCOUNTER — Ambulatory Visit (INDEPENDENT_AMBULATORY_CARE_PROVIDER_SITE_OTHER): Payer: 59 | Admitting: Family Medicine

## 2017-11-26 VITALS — BP 132/76 | HR 66 | Temp 98.3°F | Ht 64.0 in | Wt 226.0 lb

## 2017-11-26 DIAGNOSIS — M545 Low back pain: Secondary | ICD-10-CM | POA: Diagnosis not present

## 2017-11-26 DIAGNOSIS — M48061 Spinal stenosis, lumbar region without neurogenic claudication: Secondary | ICD-10-CM | POA: Diagnosis not present

## 2017-11-26 DIAGNOSIS — M549 Dorsalgia, unspecified: Secondary | ICD-10-CM

## 2017-11-26 LAB — POCT URINALYSIS DIPSTICK
Bilirubin, UA: NEGATIVE
GLUCOSE UA: NEGATIVE
KETONES UA: NEGATIVE
Nitrite, UA: NEGATIVE
Protein, UA: NEGATIVE
Spec Grav, UA: 1.025 (ref 1.010–1.025)
UROBILINOGEN UA: 0.2 U/dL
pH, UA: 6 (ref 5.0–8.0)

## 2017-11-26 NOTE — Patient Instructions (Addendum)
Thank you for coming in today. Get MRI.  This will help dictate PT.  Recheck after MRI.  Return sooner if needed.  Continue ibuprofen and tylenol as needed.   Come back or go to the emergency room if you notice new weakness new numbness problems walking or bowel or bladder problems.

## 2017-11-26 NOTE — Progress Notes (Signed)
Barbara Hooper is a 22 y.o. female who presents to [facility] today for back pain.   Barbara Hooper notes ongoing low back pain for greater than 6 months.  She was seen for this back in October or November 2018.  She was thought to have a low back strain and was prescribed physical therapy which helped to relieve her pain.  However when she returned to work she notes the pain returned.  She notes moderate low back pain without radiation.  The pain is worse with standing from seated position with flexion.  She denies any weakness or numbness or bowel bladder dysfunction.  She takes ibuprofen and Tylenol which does help.  She is continued some of her home exercises but not much.  She feels well otherwise with no fevers or chills.  She denies any significant urinary symptoms currently today.  She notes that she recently has had a resolved yeast infection. Patient prevents pregnancy via Nexplanon.  Past Medical History:  Diagnosis Date  . Elevated blood pressure   . GAD (generalized anxiety disorder) 01/09/2016  . Major depression in complete remission ([facility]) 05/11/2016   No past surgical history on file. Social History   Tobacco Use  . Smoking status: Never Smoker  . Smokeless tobacco: Never Used  Substance Use Topics  . Alcohol use: No     ROS:  As above   Medications: Current Outpatient Medications  Medication Sig Dispense Refill  . cyclobenzaprine (FLEXERIL) 10 MG tablet Take 0.5-1 tablets (5-10 mg total) by mouth at bedtime as needed for muscle spasms. 30 tablet 0  . etonogestrel (NEXPLANON) 68 MG IMPL implant 1 each once by Subdermal route.    . SUMAtriptan (IMITREX) 50 MG tablet Take 1 tablet (50 mg total) by mouth every 2 (two) hours as needed for migraine. May repeat in 2 hours if headache persists or recurs. 30 tablet 0   No current facility-administered medications for this visit.    No Known Allergies   Exam:  BP 132/76    Pulse 66   Temp 98.3 F (36.8 C) (Oral)   Ht  (1.626 m)   Wt 226 lb (102.5 kg)   BMI 38.79 kg/m  General: Well Developed, well nourished, and in no acute distress.  Neuro/Psych: Alert and oriented x3, extra-ocular muscles intact, able to move all 4 extremities, sensation grossly intact. Skin: Warm and dry, no rashes noted.  Respiratory: Not using accessory muscles, speaking in full sentences, trachea midline.  Cardiovascular: Pulses palpable, no extremity edema. Abdomen: Does not appear distended. MSK:  L-spine nontender to midline. Tender to palpation bilateral lumbar paraspinal muscle group. Range of motion normal extension. Limited rotation and lateral flexion. Limited flexion. Lower extremity strength sensation and reflexes are intact throughout.  Dg Lumbar Spine Complete  Result Date: 11/26/2017 CLINICAL DATA:  Lumbago after heavy lifting EXAM: LUMBAR SPINE - COMPLETE 4+ VIEW COMPARISON:  None. FINDINGS: Frontal, lateral, spot lumbosacral lateral, and bilateral oblique views were obtained. There are 5 non rib-bearing lumbar type vertebral bodies. T12 ribs are virtually aplastic. There is no fracture or spondylolisthesis. There is mild disc space narrowing at L4-5. Other disc spaces appear unremarkable. There is no appreciable facet arthropathy. IMPRESSION: Mild disc space narrowing at L4-5. Other disc spaces appear unremarkable. No fracture or spondylolisthesis. Electronically Signed   By: Bretta Bang III M.D.   On: 11/26/2017 11:13   I personally (independently) visualized and performed the interpretation of the images attached in  this note.    Results for orders placed or performed in visit on 11/26/17 (from the past 48 hour(s))  POCT Urinalysis Dipstick     Status: Abnormal   Collection Time: 11/26/17  8:19 AM  Result Value Ref Range   Color, UA YELLOW    Clarity, UA CLEAR    Glucose, UA NEGATIVE    Bilirubin, UA NEGATIVE    Ketones, UA NEGATIVE    Spec Grav,  UA 1.025 1.010 - 1.025   Blood, UA TRACE    pH, UA 6.0 5.0 - 8.0   Protein, UA NEGATIVE    Urobilinogen, UA 0.2 0.2 or 1.0 E.U./dL   Nitrite, UA NEGATIVE    Leukocytes, UA Trace (A) Negative   Appearance     Odor     Dg Lumbar Spine Complete  Result Date: 11/26/2017 CLINICAL DATA:  Lumbago after heavy lifting EXAM: LUMBAR SPINE - COMPLETE 4+ VIEW COMPARISON:  None. FINDINGS: Frontal, lateral, spot lumbosacral lateral, and bilateral oblique views were obtained. There are 5 non rib-bearing lumbar type vertebral bodies. T12 ribs are virtually aplastic. There is no fracture or spondylolisthesis. There is mild disc space narrowing at L4-5. Other disc spaces appear unremarkable. There is no appreciable facet arthropathy. IMPRESSION: Mild disc space narrowing at L4-5. Other disc spaces appear unremarkable. No fracture or spondylolisthesis. Electronically Signed   By: Bretta Bang III M.D.   On: 11/26/2017 11:13      Assessment and Plan: 22 y.o. female with  Chronic low back pain.  Patient has had trials of physical therapy.  Additionally she is using reasonable medication for pain control.  Symptoms ongoing no longer than 6 months.  Plan for MRI to plan for potential injection planning or to dictate physical therapy modality.  Recheck after MRI. Urinalysis with only trace leukocytes.  Patient without dysuria.  I do not think she has any urinary issues that would contribute to her pain.    Orders Placed This Encounter  Procedures  . DG Lumbar Spine Complete    Standing Status:   Future    Number of Occurrences:   1    Standing Expiration Date:   01/27/2019    Order Specific Question:   Reason for Exam (SYMPTOM  OR DIAGNOSIS REQUIRED)    Answer:   eval pain lower back for >6 months without injury    Order Specific Question:   Is patient pregnant?    Answer:   No    Order Specific Question:   Preferred imaging location?    Answer:   [facility]    Order Specific Question:    Radiology Contrast Protocol - do NOT remove file path    Answer:   \\[email or web address]  . MR Lumbar Spine Wo Contrast    Standing Status:   Future    Standing Expiration Date:   01/27/2019    Order Specific Question:   What is the patient's sedation requirement?    Answer:   No Sedation    Order Specific Question:   Does the patient have a pacemaker or implanted devices?    Answer:   No    Order Specific Question:   Preferred imaging location?    Answer:   [facility] (table limit-350lbs)    Order Specific Question:   Radiology Contrast Protocol - do NOT remove file path    Answer:   \\[email or web address]  . Ambulatory referral to Physical Therapy    Referral Priority:   Routine    Referral  Type:   Physical Medicine    Referral Reason:   Specialty Services Required    Requested Specialty:   Physical Therapy  . POCT Urinalysis Dipstick   No orders of the defined types were placed in this encounter.   Discussed warning signs or symptoms. Please see discharge instructions. Patient expresses understanding.

## 2017-12-02 ENCOUNTER — Ambulatory Visit (INDEPENDENT_AMBULATORY_CARE_PROVIDER_SITE_OTHER): Payer: 59 | Admitting: Rehabilitative and Restorative Service Providers"

## 2017-12-02 ENCOUNTER — Ambulatory Visit (INDEPENDENT_AMBULATORY_CARE_PROVIDER_SITE_OTHER): Payer: 59

## 2017-12-02 ENCOUNTER — Encounter: Payer: Self-pay | Admitting: Rehabilitative and Restorative Service Providers"

## 2017-12-02 DIAGNOSIS — M545 Low back pain, unspecified: Secondary | ICD-10-CM

## 2017-12-02 DIAGNOSIS — M5136 Other intervertebral disc degeneration, lumbar region: Secondary | ICD-10-CM

## 2017-12-02 DIAGNOSIS — G8929 Other chronic pain: Secondary | ICD-10-CM | POA: Diagnosis not present

## 2017-12-02 DIAGNOSIS — R29898 Other symptoms and signs involving the musculoskeletal system: Secondary | ICD-10-CM | POA: Diagnosis not present

## 2017-12-02 DIAGNOSIS — M549 Dorsalgia, unspecified: Secondary | ICD-10-CM

## 2017-12-02 NOTE — Therapy (Signed)
[facility] [address] [place], [place], [address] Phone: [phone]   Fax:  [phone]  Physical Therapy Treatment  Patient Details  Name: Barbara Hooper MRN: [id number] Date of Birth: Oct 21, 1995 Referring Provider: Dr Clementeen Graham    Encounter Date: 12/02/2017  PT End of Session - 12/02/17 0839    Visit Number  1    Number of Visits  12    Date for PT Re-Evaluation  01/13/18    PT Start Time  0800    PT Stop Time  0900    PT Time Calculation (min)  60 min    Activity Tolerance  Patient tolerated treatment well       Past Medical History:  Diagnosis Date  . Elevated blood pressure   . GAD (generalized anxiety disorder) 01/09/2016  . Major depression in complete remission (HCC) 05/11/2016    History reviewed. No pertinent surgical history.  There were no vitals filed for this visit.  Subjective Assessment - 12/02/17 0811    Subjective  Patient reports that she was transferring a patient from wheelchair to back whe the patient fell and [name] felt a pop in the LB. She gradually noticed increase on LBP which persisted. She was seen in PT for 4 visits with good improvement with PT and meds/rest. Patient began to have flare ups after she returned to work 1/18. 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 Recent flare up in symptoms 11/23/16 with lifting activities.     Diagnostic tests  xrays     Patient Stated Goals  get rid of pain     Currently in Pain?  Yes    Pain Score  7     Pain Location  Back    Pain Orientation  Right;Left;Lower;Mid    Pain Descriptors / Indicators  Sharp;Spasm "bruised in the middle"     Pain Type  Acute pain;Chronic pain    Pain Radiating Towards  no radiatiing pain     Pain Onset  More than a month ago    Pain Frequency  Constant    Aggravating Factors   prolonged sitting/standing; sitting straight in a chair;  bending; lifting leg up - stairs are hard     Pain Relieving Factors  lying on the couch with legs up; cold; heat; TENS unit; meds          [facility] PT Assessment - 12/02/17 0001      Assessment   Medical Diagnosis  LBP     Referring Provider  Dr Clementeen Graham     Onset Date/Surgical Date  11/23/17 10/18 original injury     Hand Dominance  Right    Next MD Visit  to schedule after MRI     Prior Therapy  yes - 4 visits here 10-11/19      Precautions   Precautions  None      Balance Screen   Has the patient fallen in the past 6 months  Yes    How many times?  3 fell down some stairs x 2; fell out of a [employer] Saturday     Has the patient had a decrease in activity level because of a fear of falling?   No    Is the patient reluctant to leave their home because of a fear of falling?   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     Leisure  household chores; sedentary - TV; ;driving       Observation/Other Assessments   Focus on Therapeutic Outcomes (FOTO)   56% limitation       Sensation   Additional Comments  WNL's per pt report       Posture/Postural Control   Posture Comments  standing with knees hyperextended; shoulders rounded      AROM   Lumbar Flexion  25% pain with flexion and return from flexion     Lumbar Extension  40%    Lumbar - Right Side Bend  80%    Lumbar - Left Side Bend  75% some pain Rt LB     Lumbar - Right Rotation  50%    Lumbar - Left Rotation  45% pain       Strength   Overall Strength Comments  5/5 bilat LE's      Flexibility   Hamstrings  tight Rt 65 deg with increase in LBP; 75 deg Lt     Quadriceps  tight Rt > Lt     ITB  tight     Piriformis  tight Lt > Rt       Palpation   Spinal mobility  hypomobility lower lumbar with CPA and Lt lateral mobs     Palpation comment   tight Lt > Rt lumbar paraspinals and QL - some tightness bilat posterior hips Rt > Lt       Special Tests   Other special tests  SLR - increased Rt LBP with Rt LE SLR - no LE radicular pain       Transfers   Comments  poor body mechanics with all transfers and transitional movements                    [facility] Adult PT Treatment/Exercise - 12/02/17 0001      Self-Care   Self-Care  -- initiated back care education       Lumbar Exercises: Stretches   Passive Hamstring Stretch  Right;Left;2 reps;30 seconds supine with strap     Press Ups  -- press up 1-2 sec hold x 10 - no pain       Lumbar Exercises: Supine   Other Supine Lumbar Exercises  3 part core 10 sec x 10 reps       Moist Heat Therapy   Number Minutes Moist Heat  20 Minutes    Moist Heat Location  Lumbar Spine      Electrical Stimulation   Electrical Stimulation Location  bilat LB    Electrical Stimulation Action  IFC    Electrical Stimulation Parameters  to tolerance    Electrical Stimulation Goals  Pain;Tone             PT Education - 12/02/17 [phone]    Education provided  Yes    Education Details  HEP DN    Person(s) Educated  Patient    Methods  Explanation;Demonstration;Tactile cues;Verbal cues;Handout    Comprehension  Verbalized understanding;Returned demonstration;Verbal cues required;Tactile cues required          PT Long Term Goals - 12/02/17 1438      PT LONG TERM GOAL #1   Title  Improve core stability allowing patient to perform exercise  and work activities without pain or discomfort 01/13/18    Time  6    Period  Weeks    Status  New      PT LONG TERM GOAL #2   Title  Improve lumbar ROM to WNL's and pain free 01/13/18    Time  6    Period  Weeks    Status  New      PT LONG TERM GOAL #3   Title  Decrease pain by 75-100% allowing patient to return to all normal functional activities 01/13/18    Time  6    Period  Weeks    Status  New      PT LONG TERM GOAL #4   Title   Independent in HEP and gym program 01/13/18    Time  6    Period  Weeks    Status  New      PT LONG TERM GOAL #5   Title  Improve FOTO to </= 39% limitation 07/05/17    Time  6    Period  Weeks    Status  New            Plan - 12/02/17 1430    Clinical Impression Statement  Barbara Hooper presents with recurrent LBP following initial injury 05/20/18 when 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 She was treated following injury with therapy, meds and rest. Patient has continued to have some intermittent problems since last seen in PT. She returns today with significant flare up including increased pain and decreased functional abilities. She has poor core strength and stability; pain with lumbar mobilization; muscular tightness through lumbar spine; poor body mechanics and pain on an ongoing basis. Patient will belefit form PT to address problems identified.     Clinical Presentation  Evolving    Clinical Decision Making  Low    Rehab Potential  Good    PT Frequency  2x / week    PT Duration  6 weeks    PT Treatment/Interventions  Patient/family education;ADLs/Self Care Home Management;Cryotherapy;Electrical Stimulation;Iontophoresis /ml Dexamethasone;Moist Heat;Ultrasound;Dry needling;Manual techniques;Neuromuscular re-education;Therapeutic activities;Therapeutic exercise    PT Next Visit Plan  review HEP; progress with core stabilization/strengthening; manual work v DN lumbar paraspinals/QL Rt > Lt; modalities as indicated; education in back care and body mechanics     Consulted and Agree with Plan of Care  Patient       Patient will benefit from skilled therapeutic intervention in order to improve the following deficits and impairments:  Postural dysfunction, Improper body mechanics, Pain, Increased muscle spasms, Decreased range of motion, Decreased activity tolerance  Visit Diagnosis: Other symptoms and signs involving the musculoskeletal system - Plan: PT plan of care  cert/re-cert  Chronic bilateral low back pain without sciatica - Plan: PT plan of care cert/re-cert     Problem List Patient Active Problem List   Diagnosis Date Noted  . Acute lumbar myofascial strain 05/21/2017  . Major depression in complete remission (HCC) 05/11/2016  . GAD (generalized anxiety disorder) 01/09/2016    Celyn Rober Minion PT, MPH  12/02/2017, 2:41 PM  [facility] [address] [place], [place], [address] Phone: [phone]   Fax:  [phone]  Name: Barbara Hooper MRN: [id number] Date of Birth: [birth year]

## 2017-12-02 NOTE — Patient Instructions (Signed)
Trunk: Prone Extension (Press-Ups)   No pain  Lie on stomach on firm, flat surface. Relax bottom and legs. Raise chest in air with elbows straight. Keep hips flat on surface, sag stomach. Hold _1-2___ seconds. Repeat _10___ times. Do __2-3__ sessions per day. CAUTION: Movement should be gentle and slow.   Abdominal Bracing With Pelvic Floor (Hook-Lying)    With neutral spine, tighten pelvic floor and abdominals sucking belly button to back bone; tighten muscules in the low back at waist. Hold 10 sec  Repeat __10_ times. Do _several__ times a day. Progress to do this in sitting standing and with functional activities   HIP: Hamstrings - Supine  Place strap around foot. Raise leg up, keeping knee straight.  Bend opposite knee to protect back if indicated. Hold 30 seconds. 3 reps per set, 2-3 sets per day  Trigger Point Dry Needling  . What is Trigger Point Dry Needling (DN)? o DN is a physical therapy technique used to treat muscle pain and dysfunction. Specifically, DN helps deactivate muscle trigger points (muscle knots).  o A thin filiform needle is used to penetrate the skin and stimulate the underlying trigger point. The goal is for a local twitch response (LTR) to occur and for the trigger point to relax. No medication of any kind is injected during the procedure.   . What Does Trigger Point Dry Needling Feel Like?  o The procedure feels different for each individual patient. Some patients report that they do not actually feel the needle enter the skin and overall the process is not painful. Very mild bleeding may occur. However, many patients feel a deep cramping in the muscle in which the needle was inserted. This is the local twitch response.   Marland Kitchen How Will I feel after the treatment? o Soreness is normal, and the onset of soreness may not occur for a few hours. Typically this soreness does not last longer than two days.  o Bruising is uncommon, however; ice can be used to  decrease any possible bruising.  o In rare cases feeling tired or nauseous after the treatment is normal. In addition, your symptoms may get worse before they get better, this period will typically not last longer than 24 hours.   . What Can I do After My Treatment? o Increase your hydration by drinking more water for the next 24 hours. o You may place ice or heat on the areas treated that have become sore, however, do not use heat on inflamed or bruised areas. Heat often brings more relief post needling. o You can continue your regular activities, but vigorous activity is not recommended initially after the treatment for 24 hours. o DN is best combined with other physical therapy such as strengthening, stretching, and other therapies.

## 2017-12-04 DIAGNOSIS — Z202 Contact with and (suspected) exposure to infections with a predominantly sexual mode of transmission: Secondary | ICD-10-CM | POA: Diagnosis not present

## 2017-12-05 ENCOUNTER — Ambulatory Visit (INDEPENDENT_AMBULATORY_CARE_PROVIDER_SITE_OTHER): Payer: 59 | Admitting: Family Medicine

## 2017-12-05 ENCOUNTER — Ambulatory Visit (INDEPENDENT_AMBULATORY_CARE_PROVIDER_SITE_OTHER): Payer: 59 | Admitting: Rehabilitative and Restorative Service Providers"

## 2017-12-05 ENCOUNTER — Encounter: Payer: Self-pay | Admitting: Family Medicine

## 2017-12-05 ENCOUNTER — Encounter: Payer: Self-pay | Admitting: Rehabilitative and Restorative Service Providers"

## 2017-12-05 VITALS — BP 128/80 | HR 69 | Ht 64.0 in | Wt 226.0 lb

## 2017-12-05 DIAGNOSIS — M545 Low back pain, unspecified: Secondary | ICD-10-CM

## 2017-12-05 DIAGNOSIS — M5136 Other intervertebral disc degeneration, lumbar region: Secondary | ICD-10-CM

## 2017-12-05 DIAGNOSIS — N898 Other specified noninflammatory disorders of vagina: Secondary | ICD-10-CM | POA: Diagnosis not present

## 2017-12-05 DIAGNOSIS — Z202 Contact with and (suspected) exposure to infections with a predominantly sexual mode of transmission: Secondary | ICD-10-CM | POA: Diagnosis not present

## 2017-12-05 DIAGNOSIS — M51369 Other intervertebral disc degeneration, lumbar region without mention of lumbar back pain or lower extremity pain: Secondary | ICD-10-CM | POA: Insufficient documentation

## 2017-12-05 DIAGNOSIS — R29898 Other symptoms and signs involving the musculoskeletal system: Secondary | ICD-10-CM | POA: Diagnosis not present

## 2017-12-05 DIAGNOSIS — G8929 Other chronic pain: Secondary | ICD-10-CM

## 2017-12-05 LAB — WET PREP FOR TRICH, YEAST, CLUE

## 2017-12-05 MED ORDER — METRONIDAZOLE 500 MG PO TABS: 500.0000 mg | ORAL_TABLET | Freq: Two times a day (BID) | ORAL | 0 refills | Status: DC

## 2017-12-05 NOTE — Patient Instructions (Addendum)
Extremity Flexion (Hook-Lying)    Tighten stomach and slowly lower right arm over head until back begins to arch. Keep core tight. Repeat __10__ times per set. Do ___2-3_ sets per session. Do ___1_ sessions per day.     Bent Leg Lift (Hook-Lying)    Tighten stomach and slowly raise right leg __10-12__ inches from floor. Keep trunk rigid. Hold _1-2___ seconds. Repeat __10__ times per set. Do __2-3__ sets per session. Do ___1_ sessions per day.    Combination (Hook-Lying)    Tighten stomach and slowly raise left leg and lower opposite arm over head. Keep core tight Repeat __10__ times per set. Do __2-3__ sets per session. Do __1__ sessions per day.  Bridging    Slowly raise buttocks from floor, keeping core tight. Repeat __10__ times per set. Do __2-3__ sets per session. Do __1__ sessions per day.   Strengthening: Hip Abductor - Resisted    With band looped around both legs above knees, push one leg out to the side - pause 2 sec then slowly bring leg back in - repeat with opposite leg  Repeat _10___ times per set. Do __2-3__ sets per session. Do __1__ sessions per day.  Trigger Point Dry Needling  . What is Trigger Point Dry Needling (DN)? o DN is a physical therapy technique used to treat muscle pain and dysfunction. Specifically, DN helps deactivate muscle trigger points (muscle knots).  o A thin filiform needle is used to penetrate the skin and stimulate the underlying trigger point. The goal is for a local twitch response (LTR) to occur and for the trigger point to relax. No medication of any kind is injected during the procedure.   . What Does Trigger Point Dry Needling Feel Like?  o The procedure feels different for each individual patient. Some patients report that they do not actually feel the needle enter the skin and overall the process is not painful. Very mild bleeding may occur. However, many patients feel a deep cramping in the muscle in which the needle was  inserted. This is the local twitch response.   Marland Kitchen How Will I feel after the treatment? o Soreness is normal, and the onset of soreness may not occur for a few hours. Typically this soreness does not last longer than two days.  o Bruising is uncommon, however; ice can be used to decrease any possible bruising.  o In rare cases feeling tired or nauseous after the treatment is normal. In addition, your symptoms may get worse before they get better, this period will typically not last longer than 24 hours.   . What Can I do After My Treatment? o Increase your hydration by drinking more water for the next 24 hours. o You may place ice or heat on the areas treated that have become sore, however, do not use heat on inflamed or bruised areas. Heat often brings more relief post needling. o You can continue your regular activities, but vigorous activity is not recommended initially after the treatment for 24 hours. o DN is best combined with other physical therapy such as strengthening, stretching, and other therapies.

## 2017-12-05 NOTE — Progress Notes (Signed)
Barbara Hooper is a 22 y.o. female who presents to [facility] today for back pain follow-up.  [name] was seen April 30 for follow-up of her back pain.  At that point she had not sufficiently improved with physical therapy trial previously.  She noted persistent back pain and an MRI was ordered.  Here today after her MRI follow-up.  She is restarted to physical therapy and notes slight improvement.  She notes the pain is worse with bending pushing and pulling.  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  She denies any radiating pain weakness or numbness fevers or chills.  Additionally she notes exposure to STD.  Her boyfriend recently tested positive for chlamydia.  Additionally he had a preliminary test positive for hepatitis B but has been vaccinated as has Designer, television/film set.  She notes little bit of vaginal discharge but denies any discomfort abdominal pain fevers or chills.   Past Medical History:  Diagnosis Date  . Elevated blood pressure   . GAD (generalized anxiety disorder) 01/09/2016  . Major depression in complete remission (HCC) 05/11/2016   No past surgical history on file. Social History   Tobacco Use  . Smoking status: Never Smoker  . Smokeless tobacco: Never Used  Substance Use Topics  . Alcohol use: No     ROS:  As above   Medications: Current Outpatient Medications  Medication Sig Dispense Refill  . cyclobenzaprine (FLEXERIL) 10 MG tablet Take 0.5-1 tablets (5-10 mg total) by mouth at bedtime as needed for muscle spasms. 30 tablet 0  . etonogestrel (NEXPLANON) 68 MG IMPL implant 1 each once by Subdermal route.    . SUMAtriptan (IMITREX) 50 MG tablet Take 1 tablet (50 mg total) by mouth every 2 (two) hours as needed for migraine. May repeat in 2 hours if headache persists or recurs. 30 tablet 0   No current facility-administered medications for this visit.    No Known Allergies   Exam:  BP  128/80   Pulse 69   Ht  (1.626 m)   Wt 226 lb (102.5 kg)   BMI 38.79 kg/m  General: Well Developed, well nourished, and in no acute distress.  Neuro/Psych: Alert and oriented x3, extra-ocular muscles intact, able to move all 4 extremities, sensation grossly intact. Skin: Warm and dry, no rashes noted.  Respiratory: Not using accessory muscles, speaking in full sentences, trachea midline.  Lungs are clear to auscultation. Cardiovascular: Pulses palpable, no extremity edema.  Regular rate and rhythm no murmurs rubs or gallops Abdomen: Does not appear distended.  Nontender MSK: L-spine: Nontender to midline.  Mildly tender to palpation bilateral lumbar paraspinal muscle group. Decreased flexion normal extension.  Decreased rotation and lateral flexion. Lower extremity strength is intact.  Normal gait.  GYN exam deferred by patient.  Results for orders placed or performed in visit on 12/05/17 (from the past 48 hour(s))  WET PREP FOR TRICH, YEAST, CLUE     Status: None   Collection Time: 12/05/17 10:31 AM  Result Value Ref Range   Source: VAGINA    RESULT      Comment: MODERATE CLUE CELLS EPITHELIAL CELLS PRESENT NO TRICHOMONAS, NO YEAST PRESENT    Mr Lumbar Spine Wo Contrast  Result Date: 12/02/2017 CLINICAL DATA:  22 year old female with persistent lumbar back pain for 6 months despite conservative treatment. On set while moving a patient in October, felt a pop. Pain when sitting.  EXAM: MRI LUMBAR SPINE WITHOUT CONTRAST TECHNIQUE: Multiplanar, multisequence MR imaging of the lumbar spine was performed. No intravenous contrast was administered. COMPARISON:  Lumbar radiographs 11/26/2017. FINDINGS: Segmentation: For the purposes of this report hypoplastic ribs are designated at T12 with full size ribs at T11 and normal lumbar spine segmentation. This is the same numbering system used on the comparison radiographs, and appears to agree with the ilio-lumbar ligament configuration which  suggests L5 is the level on series 5, image 28. Alignment: Stable mild straightening of lumbar lordosis. No spondylolisthesis. Vertebrae: No marrow edema or evidence of acute osseous abnormality. Visualized bone marrow signal is within normal limits. Intact visible sacrum and SI joints. Conus medullaris and cauda equina: Conus extends to the L1-L2 level. Conus and cauda equina appear normal. Paraspinal and other soft tissues: Negative. Disc levels: T11-T12: Negative. T12-L1:  Negative. L1-L2:  Negative. L2-L3: Subtle central disc protrusion as seen on series 5, image 13. No stenosis and otherwise negative. L3-L4: Mild disc desiccation and circumferential disc bulge. Subtle left paracentral annular fissure. Mild facet hypertrophy. No stenosis. L4-L5: Disc desiccation. Small but broad-based right paracentral disc protrusion (series 2, image 8 and series 5, image 26). Mild facet hypertrophy. No spinal stenosis or convincing neural impingement. L5-S1:  Negative. IMPRESSION: 1. Mild lumbar disc degeneration L2-L3 through L4-L5, most pronounced at the latter with a small right paracentral disc herniation. No associated spinal stenosis or convincing neural impingement. 2. No osseous abnormality. Hypoplastic ribs at T12 and normal lumbar segmentation designated as on the comparison radiographs. Electronically Signed   By: Odessa Fleming M.D.   On: 12/02/2017 13:30  I personally (independently) visualized and performed the interpretation of the images attached in this note.     Assessment and Plan: 22 y.o. female with  Back pain: Patient does have some degenerative changes and degenerative disc disease.  She has no neural impingement.  She does have some mild facet DJD per my read especially right L3 L4.  Plan to continue dedicated trial of physical therapy again.  If not better next step would be facet injection trial.  Work note provided.  Recheck in the future as needed.  STD exposure/vaginal discharge.  Self swab wet  prep was not available at the time of discharge however shows clue cells without Trichomonas.  Urine GC chlamydia and HIV RPR pending.  Will contact patient and send in metronidazole course.    Orders Placed This Encounter  Procedures  . C. trachomatis/N. gonorrhoeae RNA  . WET PREP FOR TRICH, YEAST, CLUE  . HIV antibody  . RPR   No orders of the defined types were placed in this encounter.   Discussed warning signs or symptoms. Please see discharge instructions. Patient expresses understanding.

## 2017-12-05 NOTE — Patient Instructions (Addendum)
Thank you for coming in today. Continue PT.  Let me know if PT is not helping.  Next step is injection in the back.  I can arrange for that over the phone.   I will get results to you asap.

## 2017-12-05 NOTE — Therapy (Signed)
[facility] [place] [address] [place], [place], [address] Phone: [phone]   Fax:  [phone]  Physical Therapy Treatment  Patient Details  Name: Barbara Hooper MRN: [id number] Date of Birth: 1996-06-22 Referring Provider: Dr Clementeen Graham    Encounter Date: 12/05/2017  PT End of Session - 12/05/17 0850    Visit Number  2    Number of Visits  12    Date for PT Re-Evaluation  01/13/18    PT Start Time  0845    PT Stop Time  0943    PT Time Calculation (min)  58 min    Activity Tolerance  Patient tolerated treatment well       Past Medical History:  Diagnosis Date  . Elevated blood pressure   . GAD (generalized anxiety disorder) 01/09/2016  . Major depression in complete remission (HCC) 05/11/2016    History reviewed. No pertinent surgical history.  There were no vitals filed for this visit.  Subjective Assessment - 12/05/17 0851    Subjective  Patient reports that she has been walking with her mom in the evenings. She has been working in her exercises at home as well. She notices some iprovement in her back pain.     Currently in Pain?  Yes    Pain Score  6     Pain Location  Back    Pain Orientation  Right;Left;Lower;Mid    Pain Descriptors / Indicators  Dull;Burning;Sharp    Pain Type  Acute pain;Chronic pain    Pain Onset  More than a month ago    Pain Frequency  Constant dull pain is constant - sharp pain intermittent                        [facility] Adult PT Treatment/Exercise - 12/05/17 0001      Lumbar Exercises: Stretches   Passive Hamstring Stretch  Right;Left;2 reps;30 seconds supine with strap     Standing Extension  2 reps 2-3 sec pause     Press Ups  -- press up 1-2 sec hold x 10 - no pain       Lumbar Exercises: Aerobic   Tread Mill  2.0 mph x 6 min       Lumbar Exercises: Supine   Clam  10 reps;2 seconds alternating LE's core engaged     Dead Bug  10 reps UE's and LE's separate  core engaged - then together     Bridge  10 reps;2 seconds;3 seconds core engaged    Other Supine Lumbar Exercises  3 part core 10 sec x 10 reps       Moist Heat Therapy   Number Minutes Moist Heat  20 Minutes    Moist Heat Location  Lumbar Spine      Electrical Stimulation   Electrical Stimulation Location  bilat LB    Electrical Stimulation Action  IFC    Electrical Stimulation Parameters  to tolerance    Electrical Stimulation Goals  Pain;Tone      Manual Therapy   Manual therapy comments  patient prone     Joint Mobilization  lumbar CPA mobs    Soft tissue mobilization  soft tissue mobilization lumbar paraspinals and QL bilat     Myofascial Release  lumbar paraspinals        Trigger Point Dry Needling - 12/05/17 1324    Consent Given?  Yes    Education Handout Provided  Yes  Muscles Treated Upper Body  Longissimus lumbar paraspinals bilat with stim            PT Education - 12/05/17 0911    Education provided  Yes    Education Details  HEP DN     Person(s) Educated  Patient    Methods  Explanation;Demonstration;Tactile cues;Verbal cues;Handout    Comprehension  Verbalized understanding;Returned demonstration;Verbal cues required;Tactile cues required          PT Long Term Goals - 12/05/17 0851      PT LONG TERM GOAL #1   Title  Improve core stability allowing patient to perform exercise and work activities without pain or discomfort 01/13/18    Time  6    Period  Weeks    Status  On-going      PT LONG TERM GOAL #2   Title  Improve lumbar ROM to WNL's and pain free 01/13/18    Time  6    Period  Weeks    Status  On-going      PT LONG TERM GOAL #3   Title  Decrease pain by 75-100% allowing patient to return to all normal functional activities 01/13/18    Time  6    Period  Weeks    Status  On-going      PT LONG TERM GOAL #4   Title  Independent in HEP and gym program 01/13/18    Time  6    Period  Weeks    Status  On-going      PT LONG TERM GOAL  #5   Title  Improve FOTO to </= 39% limitation 07/05/17    Time  6    Period  Weeks    Status  On-going            Plan - 12/05/17 0854    Clinical Impression Statement  Some improvement in symptoms with exercise and walking program per pt report. Patient tolerated DN without difficulty with decresaed palpable tightness noted following treatment. Will continue to work on increasing core stability and strength.     Rehab Potential  Good    PT Frequency  2x / week    PT Duration  6 weeks    PT Treatment/Interventions  Patient/family education;ADLs/Self Care Home Management;Cryotherapy;Electrical Stimulation;Iontophoresis /ml Dexamethasone;Moist Heat;Ultrasound;Dry needling;Manual techniques;Neuromuscular re-education;Therapeutic activities;Therapeutic exercise    PT Next Visit Plan  assess response to DN; review HEP; progress with core stabilization/strengthening; manual work v DN lumbar paraspinals/QL Rt > Lt; modalities as indicated; education in back care and body mechanics     Consulted and Agree with Plan of Care  Patient       Patient will benefit from skilled therapeutic intervention in order to improve the following deficits and impairments:  Postural dysfunction, Improper body mechanics, Pain, Increased muscle spasms, Decreased range of motion, Decreased activity tolerance  Visit Diagnosis: Other symptoms and signs involving the musculoskeletal system  Chronic bilateral low back pain without sciatica  Acute bilateral low back pain without sciatica     Problem List Patient Active Problem List   Diagnosis Date Noted  . Acute lumbar myofascial strain 05/21/2017  . Major depression in complete remission (HCC) 05/11/2016  . GAD (generalized anxiety disorder) 01/09/2016    Demtrius Rounds Rober Minion PT, MPH  12/05/2017, 9:24 AM  [facility] [address] [place], [place], [address] Phone: [phone]   Fax:   [phone]  Name: Barbara Hooper MRN: [id number] Date of Birth:  06/05/1996   

## 2017-12-06 LAB — C. TRACHOMATIS/N. GONORRHOEAE RNA
C. TRACHOMATIS RNA, TMA: DETECTED — AB
N. GONORRHOEAE RNA, TMA: NOT DETECTED

## 2017-12-06 LAB — RPR: RPR Ser Ql: NONREACTIVE

## 2017-12-06 LAB — HIV ANTIBODY (ROUTINE TESTING W REFLEX): HIV: NONREACTIVE

## 2017-12-09 ENCOUNTER — Encounter: Payer: Self-pay | Admitting: Physical Therapy

## 2017-12-09 ENCOUNTER — Ambulatory Visit (INDEPENDENT_AMBULATORY_CARE_PROVIDER_SITE_OTHER): Payer: 59 | Admitting: Physical Therapy

## 2017-12-09 DIAGNOSIS — G8929 Other chronic pain: Secondary | ICD-10-CM | POA: Diagnosis not present

## 2017-12-09 DIAGNOSIS — R29898 Other symptoms and signs involving the musculoskeletal system: Secondary | ICD-10-CM | POA: Diagnosis not present

## 2017-12-09 DIAGNOSIS — M545 Low back pain: Secondary | ICD-10-CM | POA: Diagnosis not present

## 2017-12-09 MED ORDER — AZITHROMYCIN 250 MG PO TABS: 1000.0000 mg | ORAL_TABLET | Freq: Once | ORAL | 0 refills | Status: DC

## 2017-12-09 MED ORDER — AZITHROMYCIN 250 MG PO TABS: 1000.0000 mg | ORAL_TABLET | Freq: Once | ORAL | 0 refills | Status: AC

## 2017-12-09 NOTE — Therapy (Signed)
[place], [place], [address] Phone: [phone]   Fax:  [phone]  Physical Therapy Treatment  Patient Details  Name: Barbara Hooper MRN: [id number] Date of Birth: Aug 11, 1995 Referring Provider: Dr. Lynne Leader   Encounter Date: 12/09/2017  PT End of Session - 12/09/17 0937    Visit Number  3    Number of Visits  12    Date for PT Re-Evaluation  01/13/18    PT Start Time  0935    PT Stop Time  1028    PT Time Calculation (min)  53 min    Activity Tolerance  Patient tolerated treatment well    Behavior During Therapy  [facility] for tasks assessed/performed       Past Medical History:  Diagnosis Date  . Elevated blood pressure   . GAD (generalized anxiety disorder) 01/09/2016  . Major depression in complete remission ([place]) 05/11/2016    History reviewed. No pertinent surgical history.  There were no vitals filed for this visit.  Subjective Assessment - 12/09/17 0937    Subjective  Pt reports the DN helped; She felt much better the following day, "Iike I had never gotten hurt".  But by 3rd day the pain had returned.  "I did a bit of a Netflix binge"; she feels the extra sitting hurt.     Currently in Pain?  Yes    Pain Score  5     Pain Location  Back    Pain Orientation  Right;Left R>L    Pain Descriptors / Indicators  Shooting;Pressure;Sharp    Aggravating Factors   prolonged sitting, sitting in a straight chair, bending         [facility] PT Assessment - 12/09/17 0001      Assessment   Medical Diagnosis  LBP     Referring Provider  Dr. Lynne Leader    Onset Date/Surgical Date  11/23/17 10/18 original injury     Hand Dominance  Right      Palpation   SI assessment   Rt ASIS lower than Lt; iliac crest level in standing; Rt sacral torsion in prone.        [place] Adult PT Treatment/Exercise - 12/09/17 0001      Lumbar Exercises: Stretches   Passive Hamstring Stretch  Right;Left;2 reps;30  seconds;Limitations supine with strap     Passive Hamstring Stretch Limitations  unable to tolerate RLE in hooklying     Standing Extension  2 reps 2-3 sec pause     Press Ups  -- press up 2-3 sec hold x 10 - no pain       Lumbar Exercises: Aerobic   Tread Mill  1.8- 2.0 mph x 6.5 min  PTA present to discuss progress      Lumbar Exercises: Seated   Sit to Stand  10 reps with core engaged.  No pain      Lumbar Exercises: Supine   Bridge  10 reps;3 seconds core engaged, pain free range.       Lumbar Exercises: Prone   Opposite Arm/Leg Raise  Right arm/Left leg;Left arm/Right leg;10 reps;2 seconds;Limitations    Opposite Arm/Leg Raise Limitations  initally reported pain with RLE raise, but eliminated with reduction of leg height.       Moist Heat Therapy   Number Minutes Moist Heat  15 Minutes    Moist Heat Location  Lumbar Spine      Electrical Stimulation   Electrical Stimulation  Location  bilat LB, Rt hip    Electrical Stimulation Action  TENS    Electrical Stimulation Parameters  to tolerance    Electrical Stimulation Goals  Tone;Pain      Manual Therapy   Manual Therapy  Muscle Energy Technique    Muscle Energy Technique  MET to correct Rt ant rotation of ilium in supine (2 sets); MET to correct Rt sacral torsion in prone.                   PT Long Term Goals - 12/05/17 0851      PT LONG TERM GOAL #1   Title  Improve core stability allowing patient to perform exercise and work activities without pain or discomfort 01/13/18    Time  6    Period  Weeks    Status  On-going      PT LONG TERM GOAL #2   Title  Improve lumbar ROM to WNL's and pain free 01/13/18    Time  6    Period  Weeks    Status  On-going      PT LONG TERM GOAL #3   Title  Decrease pain by 75-100% allowing patient to return to all normal functional activities 01/13/18    Time  6    Period  Weeks    Status  On-going      PT LONG TERM GOAL #4   Title  Independent in HEP and gym program  01/13/18    Time  6    Period  Weeks    Status  On-going      PT LONG TERM GOAL #5   Title  Improve FOTO to </= 39% limitation 07/05/17    Time  6    Period  Weeks    Status  On-going            Plan - 12/09/17 1004    Clinical Impression Statement  Pt presents with pelvis asymmetries; Rt anteriorly rotated ilium and Rt sacral torsion.  Improved alignment and reduction of pt's reported pain by 2 points with MET corrections, further reduction with use of MHP and estim at end of session.  Pt making gains towards established goals.     Rehab Potential  Good    PT Frequency  2x / week    PT Duration  6 weeks    PT Treatment/Interventions  Patient/family education;ADLs/Self Care Home Management;Cryotherapy;Electrical Stimulation;Iontophoresis 48m/ml Dexamethasone;Moist Heat;Ultrasound;Dry needling;Manual techniques;Neuromuscular re-education;Therapeutic activities;Therapeutic exercise    PT Next Visit Plan  assess pelvic alignment; continue core strengthening; manual therapy/DN as indicated.     Consulted and Agree with Plan of Care  Patient       Patient will benefit from skilled therapeutic intervention in order to improve the following deficits and impairments:  Postural dysfunction, Improper body mechanics, Pain, Increased muscle spasms, Decreased range of motion, Decreased activity tolerance  Visit Diagnosis: Chronic bilateral low back pain without sciatica  Other symptoms and signs involving the musculoskeletal system     Problem List Patient Active Problem List   Diagnosis Date Noted  . DDD (degenerative disc disease), lumbar 12/05/2017  . Acute lumbar myofascial strain 05/21/2017  . [place] 05/11/2016  . GAD (generalized anxiety disorder) 01/09/2016   JKerin Perna PTA 12/09/17 3:11 PM  [facility] [address] [address]Phone: [phone]   Fax:  [phone] Name: BAMAZING COWMANMRN: [id number]  of Birth: 1995-11-24

## 2017-12-09 NOTE — Addendum Note (Signed)
Addended by: Rodolph Bong on: 12/09/2017 06:53 AM   Modules accepted: Orders

## 2017-12-12 ENCOUNTER — Ambulatory Visit (INDEPENDENT_AMBULATORY_CARE_PROVIDER_SITE_OTHER): Payer: 59 | Admitting: Physical Therapy

## 2017-12-12 DIAGNOSIS — R29898 Other symptoms and signs involving the musculoskeletal system: Secondary | ICD-10-CM

## 2017-12-12 DIAGNOSIS — G8929 Other chronic pain: Secondary | ICD-10-CM | POA: Diagnosis not present

## 2017-12-12 DIAGNOSIS — M545 Low back pain, unspecified: Secondary | ICD-10-CM

## 2017-12-12 NOTE — Therapy (Signed)
[name], [place], [address] Phone: [phone]   Fax:  [phone]  Physical Therapy Treatment  Patient Details  Name: Barbara Hooper MRN: [id number] Date of Birth: [birth year] Referring Provider: Dr. Lynne Leader   Encounter Date: 12/12/2017  PT End of Session - 12/12/17 0945    Visit Number  4    Number of Visits  12    Date for PT Re-Evaluation  01/13/18    PT Start Time  0933    PT Stop Time  1029 ice pack last 15 min     PT Time Calculation (min)  56 min       Past Medical History:  Diagnosis Date  . Elevated blood pressure   . GAD (generalized anxiety disorder) 01/09/2016  . Major depression in complete remission ([place]) 05/11/2016    No past surgical history on file.  There were no vitals filed for this visit.  Subjective Assessment - 12/12/17 0939    Subjective  Pt reports she was a little sore in the afternoon of last session, due to having to drive/ get in  & out of car/ walking to local businesses. She reports she did not remember to engage core with transitions.   Her mom is going to buy a big therapy ball and wants her to sit on it while she watches TV.     Currently in Pain?  Yes    Pain Score  4     Pain Location  Back    Pain Orientation  Right;Left    Pain Descriptors / Indicators  Nagging    Aggravating Factors   bending, prolonged sitting    Pain Relieving Factors  TENS unit, ice.          [facility] PT Assessment - 12/12/17 0001      Assessment   Medical Diagnosis  LBP     Referring Provider  Dr. Lynne Leader    Onset Date/Surgical Date  11/23/17    Hand Dominance  Right    Next MD Visit  12/24/17      Flexibility   Hamstrings  RLE 70 deg with mild increase in LBP      Palpation   SI assessment   Rt ASIS slightly lower than Lt; slight Rt sacral torsion in prone.        [place] Adult PT Treatment/Exercise - 12/12/17 0001      Lumbar Exercises: Stretches   Passive  Hamstring Stretch  Right;Left;3 reps;30 seconds supine with strap; improved tolerance RLE    Press Ups  -- press up 2-3 sec hold x 10 - no pain       Lumbar Exercises: Aerobic   Tread Mill  2.0-2.7 mph x 6 min       Lumbar Exercises: Standing   Other Standing Lumbar Exercises  Rt step ups on 13" stool with LUE support (to simulate getting in/out of truck) x 10 reps, with VC for core engagement.       Lumbar Exercises: Seated   Sit to Stand  5 reps core engaged.     Other Seated Lumbar Exercises  seated on green therapy ball : pelvic tilts and circles.       Lumbar Exercises: Prone   Opposite Arm/Leg Raise  Right arm/Left leg;Left arm/Right leg;10 reps    Opposite Arm/Leg Raise Limitations  VC to slow down and for head position.       Lumbar Exercises: Quadruped  Opposite Arm/Leg Raise  Right arm/Left leg;Left arm/Right leg;5 reps with belly on green therapy ball      Modalities   Modalities  Cryotherapy      Cryotherapy   Number Minutes Cryotherapy  15 Minutes    Cryotherapy Location  Lumbar Spine    Type of Cryotherapy  Ice pack      Electrical Stimulation   Electrical Stimulation Location  -- pt declined.       Manual Therapy   Muscle Energy Technique  MET to correct Rt ant rotation of ilium in supine (2 sets); MET to correct Rt sacral torsion in prone.         PT Long Term Goals - 12/05/17 0851      PT LONG TERM GOAL #1   Title  Improve core stability allowing patient to perform exercise and work activities without pain or discomfort 01/13/18    Time  6    Period  Weeks    Status  On-going      PT LONG TERM GOAL #2   Title  Improve lumbar ROM to WNL's and pain free 01/13/18    Time  6    Period  Weeks    Status  On-going      PT LONG TERM GOAL #3   Title  Decrease pain by 75-100% allowing patient to return to all normal functional activities 01/13/18    Time  6    Period  Weeks    Status  On-going      PT LONG TERM GOAL #4   Title  Independent in HEP and gym  program 01/13/18    Time  6    Period  Weeks    Status  On-going      PT LONG TERM GOAL #5   Title  Improve FOTO to </= 39% limitation 07/05/17    Time  6    Period  Weeks    Status  On-going         Plan - 12/12/17 0947    Clinical Impression Statement  Improved Rt hamstring flexibility with less back irritation. Pt's pelvic alignment was not as off as last visit; continued improvement with MET corrections.  Pt reported reduction of LBP with exercises and further reduction with use of ice pack at end of session.      Rehab Potential  Good    PT Frequency  2x / week    PT Duration  6 weeks    PT Treatment/Interventions  Patient/family education;ADLs/Self Care Home Management;Cryotherapy;Electrical Stimulation;Iontophoresis '4mg'$ /ml Dexamethasone;Moist Heat;Ultrasound;Dry needling;Manual techniques;Neuromuscular re-education;Therapeutic activities;Therapeutic exercise    PT Next Visit Plan  assess pelvic alignment; continue core strengthening; manual therapy/DN as indicated.     Consulted and Agree with Plan of Care  Patient       Patient will benefit from skilled therapeutic intervention in order to improve the following deficits and impairments:  Postural dysfunction, Improper body mechanics, Pain, Increased muscle spasms, Decreased range of motion, Decreased activity tolerance  Visit Diagnosis: Chronic bilateral low back pain without sciatica  Other symptoms and signs involving the musculoskeletal system  Acute bilateral low back pain without sciatica     Problem List Patient Active Problem List   Diagnosis Date Noted  . DDD (degenerative disc disease), lumbar 12/05/2017  . Acute lumbar myofascial strain 05/21/2017  . Major depression in complete remission ([place]) 05/11/2016  . GAD (generalized anxiety disorder) 01/09/2016   Barbara Hooper, PTA 12/12/17 12:48 PM  [facility] [place],  [place], [address] Phone: [phone]   Fax:  [phone]  Name: Barbara Hooper MRN: [id number] Date of Birth: 10/06/1995

## 2017-12-16 ENCOUNTER — Ambulatory Visit (INDEPENDENT_AMBULATORY_CARE_PROVIDER_SITE_OTHER): Payer: 59 | Admitting: Physical Therapy

## 2017-12-16 DIAGNOSIS — R29898 Other symptoms and signs involving the musculoskeletal system: Secondary | ICD-10-CM

## 2017-12-16 DIAGNOSIS — M545 Low back pain, unspecified: Secondary | ICD-10-CM

## 2017-12-16 DIAGNOSIS — G8929 Other chronic pain: Secondary | ICD-10-CM

## 2017-12-16 NOTE — Therapy (Signed)
[place], [place], [address] Phone: [phone]   Fax:  [phone]  Physical Therapy Treatment  Patient Details  Name: Barbara Hooper MRN: [id number] Date of Birth: 1996-01-23 Referring Provider: Dr. Lynne Leader   Encounter Date: 12/16/2017  PT End of Session - 12/16/17 1007    Visit Number  5    Number of Visits  12    Date for PT Re-Evaluation  01/13/18    PT Start Time  0931    PT Stop Time  1025    PT Time Calculation (min)  54 min    Activity Tolerance  Patient tolerated treatment well    Behavior During Therapy  [facility] for tasks assessed/performed       Past Medical History:  Diagnosis Date  . Elevated blood pressure   . GAD (generalized anxiety disorder) 01/09/2016  . Major depression in complete remission ([place]) 05/11/2016    No past surgical history on file.  There were no vitals filed for this visit.  Subjective Assessment - 12/16/17 0934    Subjective  Pt reports she felt pretty good on Friday, only occasional pain.  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  Her back hurt that night (3/10).  Yesterday, all day she was sore - watching her 68 mo old nephew, sitting all day.  She did not perform any stretches/ exercises over weekend.     Patient Stated Goals  get rid of pain     Currently in Pain?  Yes    Pain Score  4     Pain Location  Back    Pain Orientation  Right    Pain Descriptors / Indicators  Tightness constant    Aggravating Factors   prolonged sitting.     Pain Relieving Factors  TENS unit, ice.          [facility] PT Assessment - 12/16/17 0001      Assessment   Medical Diagnosis  LBP     Referring Provider  Dr. Lynne Leader    Onset Date/Surgical Date  11/23/17    Hand Dominance  Right    Next MD Visit  12/24/17      Palpation   SI assessment   Rt  ASIS slightly lower than Lt; slight Rt sacral torsion in prone.        [place] Adult PT Treatment/Exercise - 12/16/17 0001      Lumbar Exercises: Stretches   Passive Hamstring Stretch  Right;Left;3 reps;30 seconds supine with strap; improved tolerance RLE    Standing Extension  2 reps 2-3 sec pause     Press Ups  -- press up 2-3 sec hold x 10 - no pain     Piriformis Stretch  Right;Left;30 seconds;3 reps      Lumbar Exercises: Aerobic   Tread Mill  2.0 mph x 5 min       Lumbar Exercises: Supine   Bridge  10 reps;3 seconds core engaged, pain free range.       Lumbar Exercises: Sidelying   Clam  Right;Left;15 reps      Lumbar Exercises: Prone   Opposite Arm/Leg Raise  Right arm/Left leg;Left arm/Right leg;5 reps      Lumbar Exercises: Quadruped   Opposite Arm/Leg Raise  Right arm/Left leg;Left arm/Right leg;5 reps with belly on green therapy ball  Modalities   Modalities  Cryotherapy      Cryotherapy   Number Minutes Cryotherapy  15 Minutes    Cryotherapy Location  Lumbar Spine    Type of Cryotherapy  Ice pack      Electrical Stimulation   Electrical Stimulation Location  bilat LB, Rt hip    Electrical Stimulation Action  IFC    Electrical Stimulation Parameters  to tolerance    Electrical Stimulation Goals  Pain      Manual Therapy   Muscle Energy Technique  MET to correct Rt ant rotation of ilium in supine; MET to correct Rt sacral torsion in prone.                   PT Long Term Goals - 12/05/17 0851      PT LONG TERM GOAL #1   Title  Improve core stability allowing patient to perform exercise and work activities without pain or discomfort 01/13/18    Time  6    Period  Weeks    Status  On-going      PT LONG TERM GOAL #2   Title  Improve lumbar ROM to WNL's and pain free 01/13/18    Time  6    Period  Weeks    Status  On-going      PT LONG TERM GOAL #3   Title  Decrease pain by 75-100% allowing patient to return to all normal functional  activities 01/13/18    Time  6    Period  Weeks    Status  On-going - reporting 40% improvement     PT LONG TERM GOAL #4   Title  Independent in HEP and gym program 01/13/18    Time  6    Period  Weeks    Status  On-going      PT LONG TERM GOAL #5   Title  Improve FOTO to </= 39% limitation 07/05/17    Time  6    Period  Weeks    Status  On-going            Plan - 12/16/17 1001    Clinical Impression Statement  Flare up of low back symptoms over weekend due to prolonged sitting and dance performance.  Pt reported reduction of pain with exercise and MET corrections.  Further reduction with ice and estim at end of session.  Encouraged pt to comply with HEP outside of therapy sessions and be more mindful of seated posture/positions (avoiding agrivating factors).  Progressing towards therapy goals.     Rehab Potential  Good    PT Frequency  2x / week    PT Duration  6 weeks    PT Treatment/Interventions  Patient/family education;ADLs/Self Care Home Management;Cryotherapy;Electrical Stimulation;Iontophoresis 48m/ml Dexamethasone;Moist Heat;Ultrasound;Dry needling;Manual techniques;Neuromuscular re-education;Therapeutic activities;Therapeutic exercise    PT Next Visit Plan  assess pelvic alignment; continue core strengthening; manual therapy/DN as indicated.     Consulted and Agree with Plan of Care  Patient       Patient will benefit from skilled therapeutic intervention in order to improve the following deficits and impairments:  Postural dysfunction, Improper body mechanics, Pain, Increased muscle spasms, Decreased range of motion, Decreased activity tolerance  Visit Diagnosis: Chronic bilateral low back pain without sciatica  Other symptoms and signs involving the musculoskeletal system  Acute bilateral low back pain without sciatica     Problem List Patient Active Problem List   Diagnosis Date Noted  . DDD (degenerative disc disease),  lumbar 12/05/2017  . Acute lumbar  myofascial strain 05/21/2017  . Major depression in complete remission ([place]) 05/11/2016  . GAD (generalized anxiety disorder) 01/09/2016   Kerin Perna, PTA 12/16/17 10:14 AM  [place], [place], [address] Phone: [phone]   Fax:  [phone]  Name: Barbara Hooper MRN: [id number] Date of Birth: Jun 06, 1996

## 2017-12-19 ENCOUNTER — Encounter: Payer: Self-pay | Admitting: Physical Therapy

## 2017-12-19 ENCOUNTER — Ambulatory Visit (INDEPENDENT_AMBULATORY_CARE_PROVIDER_SITE_OTHER): Payer: 59 | Admitting: Physical Therapy

## 2017-12-19 DIAGNOSIS — R29898 Other symptoms and signs involving the musculoskeletal system: Secondary | ICD-10-CM | POA: Diagnosis not present

## 2017-12-19 DIAGNOSIS — M545 Low back pain, unspecified: Secondary | ICD-10-CM

## 2017-12-19 DIAGNOSIS — G8929 Other chronic pain: Secondary | ICD-10-CM

## 2017-12-19 NOTE — Therapy (Signed)
[facility] [place] [address] [place], [place], [address] Phone: [phone]   Fax:  [phone]  Physical Therapy Treatment  Patient Details  Name: Barbara Hooper MRN: [id number] Date of Birth: [birth year] Referring Provider: Dr. Clementeen Graham   Encounter Date: 12/19/2017  PT End of Session - 12/19/17 0854    Visit Number  6    Number of Visits  12    Date for PT Re-Evaluation  01/13/18    PT Start Time  0854    PT Stop Time  0945    PT Time Calculation (min)  51 min    Activity Tolerance  Patient tolerated treatment well       Past Medical History:  Diagnosis Date  . Elevated blood pressure   . GAD (generalized anxiety disorder) 01/09/2016  . Major depression in complete remission (HCC) 05/11/2016    History reviewed. No pertinent surgical history.  There were no vitals filed for this visit.  Subjective Assessment - 12/19/17 0856    Subjective  Pt reports she is still a little sore in the low back and into the Rt hip. Interested in DN again.     Currently in Pain?  Yes    Pain Score  4     Pain Location  Back    Pain Orientation  Right    Pain Descriptors / Indicators  Tightness    Pain Type  Chronic pain    Pain Onset  More than a month ago    Pain Frequency  Intermittent                       [facility] Adult PT Treatment/Exercise - 12/19/17 0001      Self-Care   Self-Care  Other Self-Care Comments    Other Self-Care Comments   self trigger point release to Rt buttocks.       Lumbar Exercises: Stretches   Piriformis Stretch  Left;Right;30 seconds      Lumbar Exercises: Aerobic   Tread Mill  2.0 mph x 5 min       Lumbar Exercises: Supine   Single Leg Bridge  15 reps figure 4's each side      Lumbar Exercises: Sidelying   Clam  15 reps;Both 2# each side, reverese      Modalities   Modalities  Electrical Stimulation;Moist Heat      Moist Heat Therapy   Number Minutes Moist Heat  15  Minutes    Moist Heat Location  Lumbar Spine      Electrical Stimulation   Electrical Stimulation Location  bilat LBP, Rt buttock    Electrical Stimulation Action  IFC    Electrical Stimulation Parameters  to tolerance    Electrical Stimulation Goals  Pain;Tone      Manual Therapy   Soft tissue mobilization  STM and TPR to Rt piriformis       Trigger Point Dry Needling - 12/19/17 0857    Consent Given?  Yes    Education Handout Provided  No    Muscles Treated Upper Body  Longissimus    Muscles Treated Lower Body  Piriformis    Longissimus Response  -- one stick at T12 - to uncomfortable so stopped    Piriformis Response  Palpable increased muscle length;Twitch response elicited rt with stim                PT Long Term Goals - 12/05/17 3244  PT LONG TERM GOAL #1   Title  Improve core stability allowing patient to perform exercise and work activities without pain or discomfort 01/13/18    Time  6    Period  Weeks    Status  On-going      PT LONG TERM GOAL #2   Title  Improve lumbar ROM to WNL's and pain free 01/13/18    Time  6    Period  Weeks    Status  On-going      PT LONG TERM GOAL #3   Title  Decrease pain by 75-100% allowing patient to return to all normal functional activities 01/13/18    Time  6    Period  Weeks    Status  On-going      PT LONG TERM GOAL #4   Title  Independent in HEP and gym program 01/13/18    Time  6    Period  Weeks    Status  On-going      PT LONG TERM GOAL #5   Title  Improve FOTO to </= 39% limitation 07/05/17    Time  6    Period  Weeks    Status  On-going            Plan - 12/19/17 0932    Clinical Impression Statement  Artia had good release of Rt piriformis with manual work, reported decreased symptoms afterwards.  She also tolerated self TPR with ball well.     Rehab Potential  Good    PT Frequency  2x / week    PT Duration  6 weeks    PT Treatment/Interventions  Patient/family education;ADLs/Self Care  Home Management;Cryotherapy;Electrical Stimulation;Iontophoresis /ml Dexamethasone;Moist Heat;Ultrasound;Dry needling;Manual techniques;Neuromuscular re-education;Therapeutic activities;Therapeutic exercise    PT Next Visit Plan  assess response to DN and progress core     Consulted and Agree with Plan of Care  Patient       Patient will benefit from skilled therapeutic intervention in order to improve the following deficits and impairments:  Postural dysfunction, Improper body mechanics, Pain, Increased muscle spasms, Decreased range of motion, Decreased activity tolerance  Visit Diagnosis: Chronic bilateral low back pain without sciatica  Other symptoms and signs involving the musculoskeletal system  Acute bilateral low back pain without sciatica     Problem List Patient Active Problem List   Diagnosis Date Noted  . DDD (degenerative disc disease), lumbar 12/05/2017  . Acute lumbar myofascial strain 05/21/2017  . Major depression in complete remission (HCC) 05/11/2016  . GAD (generalized anxiety disorder) 01/09/2016    Jersey Espinoza PT  12/19/2017, 9:33 AM  [facility] [address] [place], [place], [address] Phone: [phone]   Fax:  [phone]  Name: Barbara Hooper MRN: [id number] Date of Birth: 24-Nov-1995

## 2017-12-24 ENCOUNTER — Ambulatory Visit (INDEPENDENT_AMBULATORY_CARE_PROVIDER_SITE_OTHER): Payer: 59 | Admitting: Physical Therapy

## 2017-12-24 ENCOUNTER — Ambulatory Visit: Payer: 59 | Admitting: Family Medicine

## 2017-12-24 ENCOUNTER — Other Ambulatory Visit: Payer: Self-pay | Admitting: Family Medicine

## 2017-12-24 DIAGNOSIS — R29898 Other symptoms and signs involving the musculoskeletal system: Secondary | ICD-10-CM

## 2017-12-24 DIAGNOSIS — G8929 Other chronic pain: Secondary | ICD-10-CM | POA: Diagnosis not present

## 2017-12-24 DIAGNOSIS — M545 Low back pain: Secondary | ICD-10-CM

## 2017-12-24 DIAGNOSIS — Z202 Contact with and (suspected) exposure to infections with a predominantly sexual mode of transmission: Secondary | ICD-10-CM

## 2017-12-24 DIAGNOSIS — A749 Chlamydial infection, unspecified: Secondary | ICD-10-CM | POA: Diagnosis not present

## 2017-12-24 NOTE — Therapy (Signed)
[place], [place], [address] Phone: [phone]   Fax:  [phone]  Physical Therapy Treatment  Patient Details  Name: Barbara Hooper MRN: [id number] Date of Birth: 07-Jul-1996 Referring Provider: Dr. Lynne Leader    Encounter Date: 12/24/2017  PT End of Session - 12/24/17 1017    Visit Number  7    Number of Visits  12    Date for PT Re-Evaluation  01/13/18    PT Start Time  0933    PT Stop Time  8264    PT Time Calculation (min)  56 min       Past Medical History:  Diagnosis Date  . Elevated blood pressure   . GAD (generalized anxiety disorder) 01/09/2016  . Major depression in complete remission ([place]) 05/11/2016    No past surgical history on file.  There were no vitals filed for this visit.  Subjective Assessment - 12/24/17 0934    Subjective  Pt reports her Rt hip is still tight, but she is not having as much pain.  She still notices pain when lifting her RLE, but it has improved.  Pain mostly in back if she sits on her old couch or bends the wrong way.     Currently in Pain?  No/denies    Pain Score  0-No pain         [facility] PT Assessment - 12/24/17 0001      Assessment   Medical Diagnosis  LBP     Referring Provider  Dr. Lynne Leader     Onset Date/Surgical Date  11/23/17    Hand Dominance  Right    Next MD Visit  PRN      Flexibility   Hamstrings  RLE at 95 deg, however "catch" and pain in LB @45  deg       [facility] Adult PT Treatment/Exercise - 12/24/17 0001      Lumbar Exercises: Stretches   Passive Hamstring Stretch  Right;Left;3 reps;30 seconds supine with strap; improved tolerance RLE    Press Ups  -- press up 2-3 sec hold x 10 - no pain     Piriformis Stretch  Left;Right;30 seconds;2 reps      Lumbar Exercises: Aerobic   Tread Mill  2.3 mph x 5 min       Lumbar Exercises: Standing   Other Standing Lumbar Exercises  simulated bed mobility and stand pivot transfer  with volunteer (PTA present to give VC and demo) - x 2 reps       Lumbar Exercises: Supine   Single Leg Bridge  15 reps figure 4's each side      Lumbar Exercises: Sidelying   Clam  15 reps;Both 2# each side, reverse      Lumbar Exercises: Prone   Opposite Arm/Leg Raise  Right arm/Left leg;Left arm/Right leg;5 reps;5 seconds core engaged.       Modalities   Modalities  Electrical Stimulation;Cryotherapy      Cryotherapy   Number Minutes Cryotherapy  15 Minutes    Cryotherapy Location  Lumbar Spine    Type of Cryotherapy  Ice pack      Electrical Stimulation   Electrical Stimulation Location  bilat LBP, Rt buttock    Electrical Stimulation Action  IFC    Electrical Stimulation Parameters  to tolerance      Manual Therapy   Soft tissue mobilization  STM and TPR to Rt piriformis    Muscle Energy Technique  MET to correct Rt sacral torsion in prone.                   PT Long Term Goals - 12/24/17 0939      PT LONG TERM GOAL #1   Title  Improve core stability allowing patient to perform exercise and work activities without pain or discomfort 01/13/18    Time  6    Period  Weeks    Status  On-going improving      PT LONG TERM GOAL #2   Title  Improve lumbar ROM to WNL's and pain free 01/13/18    Time  6    Period  Weeks    Status  On-going      PT LONG TERM GOAL #3   Title  Decrease pain by 75-100% allowing patient to return to all normal functional activities 01/13/18    Time  6    Period  Weeks    Status  Partially Met 80% reduction in pain, but not returned to work.       PT LONG TERM GOAL #4   Title  Independent in HEP and gym program 01/13/18    Time  6    Period  Weeks    Status  On-going      PT LONG TERM GOAL #5   Title  Improve FOTO to </= 39% limitation 07/05/17    Time  6    Period  Weeks    Status  On-going            Plan - 12/24/17 1007    Clinical Impression Statement  Improvement in overall pain and functional mobility. Pt more  aware of aggrivating factors and reports improved compliance with HEP.  She has continued palpable tightness/tenderness in Rt glute/hip rotators; may benefit from additional session with DN to area .Pt has partially met LTG#3 and is progressing well towards goals.     Rehab Potential  Good    PT Frequency  2x / week    PT Duration  6 weeks    PT Treatment/Interventions  Patient/family education;ADLs/Self Care Home Management;Cryotherapy;Electrical Stimulation;Iontophoresis 69m/ml Dexamethasone;Moist Heat;Ultrasound;Dry needling;Manual techniques;Neuromuscular re-education;Therapeutic activities;Therapeutic exercise    PT Next Visit Plan  progress core; DN/manual therapy to Rt hip/LB.     Consulted and Agree with Plan of Care  Patient       Patient will benefit from skilled therapeutic intervention in order to improve the following deficits and impairments:  Postural dysfunction, Improper body mechanics, Pain, Increased muscle spasms, Decreased range of motion, Decreased activity tolerance  Visit Diagnosis: Chronic bilateral low back pain without sciatica  Other symptoms and signs involving the musculoskeletal system     Problem List Patient Active Problem List   Diagnosis Date Noted  . DDD (degenerative disc disease), lumbar 12/05/2017  . Acute lumbar myofascial strain 05/21/2017  . [place] depression in complete remission ([place] 05/11/2016  . GAD (generalized anxiety disorder) 01/09/2016   JKerin Perna PTA 12/24/17 10:19 AM  [facility] [address]Phone: [phone]  Fax:  [phone] Name: Barbara WINTERHALTERMRN: [id number] of Birth: [birth year]

## 2017-12-24 NOTE — Progress Notes (Signed)
Lab ordered for test of cure.

## 2017-12-25 LAB — C. TRACHOMATIS/N. GONORRHOEAE RNA
C. trachomatis RNA, TMA: NOT DETECTED
N. gonorrhoeae RNA, TMA: NOT DETECTED

## 2017-12-26 ENCOUNTER — Encounter: Payer: Self-pay | Admitting: Rehabilitative and Restorative Service Providers"

## 2017-12-26 ENCOUNTER — Ambulatory Visit (INDEPENDENT_AMBULATORY_CARE_PROVIDER_SITE_OTHER): Payer: 59 | Admitting: Rehabilitative and Restorative Service Providers"

## 2017-12-26 DIAGNOSIS — G8929 Other chronic pain: Secondary | ICD-10-CM

## 2017-12-26 DIAGNOSIS — M545 Low back pain, unspecified: Secondary | ICD-10-CM

## 2017-12-26 DIAGNOSIS — R29898 Other symptoms and signs involving the musculoskeletal system: Secondary | ICD-10-CM

## 2017-12-26 NOTE — Patient Instructions (Signed)
  Sitting at edge of straight chair; core tight flatten back then arch back x 10 .   Low Row: Standing   Face anchor, feet shoulder width apart. Palms up, pull arms back, squeezing shoulder blades together. Repeat 10__ times per set. Do 2-3__ sets per session. Do 1 x/day    Strengthening: Resisted Extension   Hold tubing in right hand, arm forward. Pull arm back, elbow straight. Repeat _10___ times per set. Do 2-3____ sets per session. Do 1__ sessions per day.  Bow and arrow - with green band

## 2017-12-26 NOTE — Therapy (Signed)
[place], [place], [address] Phone: [phone]   Fax:  [phone]  Physical Therapy Treatment  Patient Details  Name: Barbara Hooper MRN: [id number] Date of Birth: 07-09-1996 Referring Provider: Dr. Lynne Leader    Encounter Date: 12/26/2017  PT End of Session - 12/26/17 0936    Visit Number  8    Number of Visits  12    Date for PT Re-Evaluation  01/13/18    PT Start Time  0933    PT Stop Time  1028    PT Time Calculation (min)  55 min    Activity Tolerance  Patient tolerated treatment well       Past Medical History:  Diagnosis Date  . Elevated blood pressure   . GAD (generalized anxiety disorder) 01/09/2016  . Major depression in complete remission ([place]) 05/11/2016    History reviewed. No pertinent surgical history.  There were no vitals filed for this visit.  Subjective Assessment - 12/26/17 0937    Subjective  Patient reports that she continues to improve. She is walking some with her mom but not as much as they were. She is using the ball on the wall to work on the tightness in the hip. That is much improved - still has some tightness in the center of LB.  she does not want to try DN today b/c the hip is feeling better and she will be out of town for a wedding this weekend.     Currently in Pain?  Yes    Pain Score  3     Pain Location  Back    Pain Orientation  Right;Lower;Mid    Pain Descriptors / Indicators  Tightness;Sore    Pain Type  Chronic pain    Pain Onset  More than a month ago    Pain Frequency  Intermittent    Aggravating Factors   prolonged sitting     Pain Relieving Factors  TENs unit ; ice; walking andn stretching                        [facility] Adult PT Treatment/Exercise - 12/26/17 0001      Lumbar Exercises: Aerobic   Tread Mill  2.5 mph x 5 min       Lumbar Exercises: Standing   Wall Slides  10 reps;5 seconds w/therapy ball on wall - core  engaged     Row  Strengthening;Both;20 reps;Theraband    Theraband Level (Row)  Level 3 (Green)    Row Limitations  step back bow and arrow x 20 green TB - core engaged     Shoulder Extension  Strengthening;Both;20 reps;Theraband    Theraband Level (Shoulder Extension)  Level 3 (Green)    Other Standing Lumbar Exercises  ball on wall overhead bouncing and rolling ball x 1 min x 2 reps - core engaged       Lumbar Exercises: Seated   Sit to Stand  10 reps core engaged     Other Seated Lumbar Exercises  segmental movement anterior and posterior pelvic tilt sitting at edge of table x 10       Lumbar Exercises: Supine   Single Leg Bridge  15 reps figure 4's each side      Lumbar Exercises: Sidelying   Clam  15 reps;Both 2# each side, reverse      Lumbar Exercises: Prone   Opposite Arm/Leg Raise  Right arm/Left  leg;Left arm/Right leg;5 reps;5 seconds core engaged.       Cryotherapy   Number Minutes Cryotherapy  15 Minutes    Cryotherapy Location  Lumbar Spine    Type of Cryotherapy  Ice pack      Electrical Stimulation   Electrical Stimulation Location  bilat LBP, Rt buttock    Electrical Stimulation Action  IFC    Electrical Stimulation Parameters  to tolerance    Electrical Stimulation Goals  Pain;Tone      Manual Therapy   Soft tissue mobilization  deep tissue work through the lumbar spine musculature into piriformis and hip abductors Rt              PT Education - 12/26/17 0958    Education provided  Yes    Education Details  HEP    Person(s) Educated  Patient    Methods  Explanation;Demonstration;Tactile cues;Verbal cues;Handout    Comprehension  Verbalized understanding;Returned demonstration;Verbal cues required;Tactile cues required          PT Long Term Goals - 12/26/17 0936      PT LONG TERM GOAL #1   Title  Improve core stability allowing patient to perform exercise and work activities without pain or discomfort 01/13/18    Time  6    Period  Weeks     Status  On-going      PT LONG TERM GOAL #2   Title  Improve lumbar ROM to WNL's and pain free 01/13/18    Time  6    Period  Weeks    Status  Partially Met      PT LONG TERM GOAL #3   Title  Decrease pain by 75-100% allowing patient to return to all normal functional activities 01/13/18    Time  6    Status  Partially Met      PT LONG TERM GOAL #4   Title  Independent in HEP and gym program 01/13/18    Time  6    Period  Weeks    Status  On-going      PT LONG TERM GOAL #5   Title  Improve FOTO to </= 39% limitation 07/05/17    Time  6    Period  Weeks    Status  On-going            Plan - 12/26/17 1015    Clinical Impression Statement  Continued improvement in Rt hip/buttocks tightness and pain. She has persistent intermittent pain in the cernter of the LB. Tightnes with CPA mobs through the lumbar area. Added core stabilization exercises without difficulty. Progressing well toward goals of theapy.     Rehab Potential  Good    PT Frequency  2x / week    PT Duration  6 weeks    PT Treatment/Interventions  Patient/family education;ADLs/Self Care Home Management;Cryotherapy;Electrical Stimulation;Iontophoresis 56m/ml Dexamethasone;Moist Heat;Ultrasound;Dry needling;Manual techniques;Neuromuscular re-education;Therapeutic activities;Therapeutic exercise    PT Next Visit Plan  progress core; DN/manual therapy to Rt hip/LB if pt elects to try DN again     Consulted and Agree with Plan of Care  Patient       Patient will benefit from skilled therapeutic intervention in order to improve the following deficits and impairments:  Postural dysfunction, Improper body mechanics, Pain, Increased muscle spasms, Decreased range of motion, Decreased activity tolerance  Visit Diagnosis: Chronic bilateral low back pain without sciatica  Other symptoms and signs involving the musculoskeletal system  Acute bilateral low back pain without sciatica  Problem List Patient Active Problem  List   Diagnosis Date Noted  . DDD (degenerative disc disease), lumbar 12/05/2017  . Acute lumbar myofascial strain 05/21/2017  . Major depression in complete remission ([place]) 05/11/2016  . GAD (generalized anxiety disorder) 01/09/2016    Celyn Nilda Simmer PT, MPH  12/26/2017, 10:19 AM  [facility] [place], [place], [address] Phone: [phone]   Fax:  [phone]  Name: Barbara Hooper MRN: [id number] Date of Birth: Nov 20, 1995

## 2017-12-31 ENCOUNTER — Ambulatory Visit (INDEPENDENT_AMBULATORY_CARE_PROVIDER_SITE_OTHER): Payer: 59 | Admitting: Physical Therapy

## 2017-12-31 DIAGNOSIS — M545 Low back pain, unspecified: Secondary | ICD-10-CM

## 2017-12-31 DIAGNOSIS — G8929 Other chronic pain: Secondary | ICD-10-CM | POA: Diagnosis not present

## 2017-12-31 DIAGNOSIS — R29898 Other symptoms and signs involving the musculoskeletal system: Secondary | ICD-10-CM

## 2017-12-31 NOTE — Therapy (Signed)
[place], [place], [address] Phone: [phone]   Fax:  [phone]  Physical Therapy Treatment  Patient Details  Name: Barbara Hooper MRN: [id number] Date of Birth: Jul 26, 1996 Referring Provider: Dr. Lynne Leader   Encounter Date: 12/31/2017  PT End of Session - 12/31/17 0807    Visit Number  9    Number of Visits  12    Date for PT Re-Evaluation  01/13/18    PT Start Time  0803    PT Stop Time  3428    PT Time Calculation (min)  51 min       Past Medical History:  Diagnosis Date  . Elevated blood pressure   . GAD (generalized anxiety disorder) 01/09/2016  . Major depression in complete remission ([place]) 05/11/2016    No past surgical history on file.  There were no vitals filed for this visit.  Subjective Assessment - 12/31/17 0808    Subjective  Pt reports she fell down a few stairs over the weekend while attending wedding.  She was sore the next day, and has some residual pain in the center of her spine today.  She realizes she has to keep doing exercises daily to keep back pain at bay.  "My hip is not cranky anymore".     Patient Stated Goals  get rid of pain     Currently in Pain?  Yes    Pain Score  2     Pain Location  Back    Pain Orientation  Right;Left;Lower    Pain Descriptors / Indicators  Aching "grabby"    Aggravating Factors   prolonged sitting, especially without support    Pain Relieving Factors  TENs, ice, walking, stretching         [facility] PT Assessment - 12/31/17 0001      Assessment   Medical Diagnosis  LBP     Referring Provider  Dr. Lynne Leader    Onset Date/Surgical Date  11/23/17    Hand Dominance  Right    Next MD Visit  PRN      Flexibility   Hamstrings  RLE at 90 deg, with "catch" in LBP with SLR at ~65 deg.        [facility] Adult PT Treatment/Exercise - 12/31/17 0001      Lumbar Exercises: Stretches   Passive Hamstring Stretch  Right;Left;3 reps;30  seconds supine with strap; improved tolerance RLE    Press Ups  3 reps;10 seconds after bridging with feet on ball.     Piriformis Stretch  Left;Right;30 seconds;2 reps      Lumbar Exercises: Aerobic   Tread Mill  2.5 mph x 5 min       Lumbar Exercises: Standing   Row  Strengthening;Both;20 reps;Theraband core engaged.     Theraband Level (Row)  Level 3 (Green)    Row Limitations  (step back, like bow and arrow)    Shoulder Extension  Strengthening;Both;20 reps;Theraband    Theraband Level (Shoulder Extension)  Level 3 (Green)    Other Standing Lumbar Exercises  anti rotation with green band in and out from core x 5 reps each side, 2 sets      Lumbar Exercises: Seated   Other Seated Lumbar Exercises  wood chop with green band while sitting on Green therapy ball x 15 some "tenderness in R LB" with Rt rotation     Other Seated Lumbar Exercises  seated cat/cow with segmental movemnt.  Lumbar Exercises: Supine   Bridge  5 reps feet on green ball, arms at side.       Cryotherapy   Number Minutes Cryotherapy  15 Minutes    Cryotherapy Location  Lumbar Spine    Type of Cryotherapy  Ice pack      Electrical Stimulation   Electrical Stimulation Location  bilat LBP, Rt buttock    Electrical Stimulation Action  TENS    Electrical Stimulation Parameters  to tolerance    Electrical Stimulation Goals  Pain                  PT Long Term Goals - 12/26/17 0936      PT LONG TERM GOAL #1   Title  Improve core stability allowing patient to perform exercise and work activities without pain or discomfort 01/13/18    Time  6    Period  Weeks    Status  On-going      PT LONG TERM GOAL #2   Title  Improve lumbar ROM to WNL's and pain free 01/13/18    Time  6    Period  Weeks    Status  Partially Met      PT LONG TERM GOAL #3   Title  Decrease pain by 75-100% allowing patient to return to all normal functional activities 01/13/18    Time  6    Status  Partially Met      PT  LONG TERM GOAL #4   Title  Independent in HEP and gym program 01/13/18    Time  6    Period  Weeks    Status  On-going      PT LONG TERM GOAL #5   Title  Improve FOTO to </= 39% limitation 07/05/17    Time  6    Period  Weeks    Status  On-going            Plan - 12/31/17 0846    Clinical Impression Statement  Pt tolerated all exercises well, with minimal increase in discomfort.  She has had a slight increase in pain from fall over weekend, otherwise showing improvement in functional mobility and decreased pain level.    Pt painfree at end of session. She has partially met her goals and will benefit from continued PT to maximize mobility.    Rehab Potential  Good    PT Frequency  2x / week    PT Duration  6 weeks    PT Treatment/Interventions  Patient/family education;ADLs/Self Care Home Management;Cryotherapy;Electrical Stimulation;Iontophoresis 61m/ml Dexamethasone;Moist Heat;Ultrasound;Dry needling;Manual techniques;Neuromuscular re-education;Therapeutic activities;Therapeutic exercise    PT Next Visit Plan  progress core   Consulted and Agree with Plan of Care  Patient       Patient will benefit from skilled therapeutic intervention in order to improve the following deficits and impairments:  Postural dysfunction, Improper body mechanics, Pain, Increased muscle spasms, Decreased range of motion, Decreased activity tolerance  Visit Diagnosis: Chronic bilateral low back pain without sciatica  Other symptoms and signs involving the musculoskeletal system  Acute bilateral low back pain without sciatica     Problem List Patient Active Problem List   Diagnosis Date Noted  . DDD (degenerative disc disease), lumbar 12/05/2017  . Acute lumbar myofascial strain 05/21/2017  . Major depression in complete remission ([place] 05/11/2016  . GAD (generalized anxiety disorder) 01/09/2016   JKerin Perna PTA 12/31/17 1:31 PM  [place] [address]  [place], [place], [address] Phone: [phone]   Fax:  [phone]  Name: Barbara Hooper MRN: [id number] Date of Birth: 06-13-1996

## 2018-01-07 ENCOUNTER — Ambulatory Visit (INDEPENDENT_AMBULATORY_CARE_PROVIDER_SITE_OTHER): Payer: 59 | Admitting: Physical Therapy

## 2018-01-07 DIAGNOSIS — M545 Low back pain: Secondary | ICD-10-CM | POA: Diagnosis not present

## 2018-01-07 DIAGNOSIS — G8929 Other chronic pain: Secondary | ICD-10-CM | POA: Diagnosis not present

## 2018-01-07 DIAGNOSIS — R29898 Other symptoms and signs involving the musculoskeletal system: Secondary | ICD-10-CM | POA: Diagnosis not present

## 2018-01-07 NOTE — Therapy (Signed)
[place], [place], [address] Phone: [phone]   Fax:  [phone]  Physical Therapy Treatment  Patient Details  Name: Barbara Hooper MRN: [id number] Date of Birth: October 29, 1995 Referring Provider: Dr. Lynne Leader   Encounter Date: 01/07/2018  PT End of Session - 01/07/18 0930    Visit Number  10    Number of Visits  12    Date for PT Re-Evaluation  01/13/18    PT Start Time  0845    PT Stop Time  0927    PT Time Calculation (min)  42 min    Activity Tolerance  Patient tolerated treatment well;No increased pain    Behavior During Therapy  WFL for tasks assessed/performed       Past Medical History:  Diagnosis Date  . Elevated blood pressure   . GAD (generalized anxiety disorder) 01/09/2016  . Major depression in complete remission ([place]) 05/11/2016    No past surgical history on file.  There were no vitals filed for this visit.  Subjective Assessment - 01/07/18 0849    Subjective  Pt reports she has only had one episode of back pain since last visit, brought on by hiking and dancing.  Resolved with rest and stretches.   She reports some tightness first thing in the mornings, resolved with LE stretches.     Patient Stated Goals  get rid of pain     Currently in Pain?  No/denies    Pain Score  0-No pain         [facility] PT Assessment - 01/07/18 0001      Assessment   Medical Diagnosis  LBP     Referring Provider  Dr. Lynne Leader    Onset Date/Surgical Date  11/23/17    Hand Dominance  Right    Next MD Visit  PRN      AROM   Overall AROM Comments  painfree range.     Lumbar Flexion  fingertips to toes    Lumbar Extension  WNL    Lumbar - Right Side Bend  WNL    Lumbar - Left Side Bend  WNL    Lumbar - Right Rotation  WNL    Lumbar - Left Rotation  WNL      Flexibility   Hamstrings  RLE at 90 deg, with "catch" in LBP with SLR at ~65 deg.         [facility]  Treatment/Exercise - 01/07/18 0001      Lumbar Exercises: Stretches   Passive Hamstring Stretch  Right;Left;3 reps;30 seconds supine with strap; improved tolerance RLE    Press Ups  10 seconds;5 reps after bridging with feet on ball.     Piriformis Stretch  Left;Right;30 seconds;2 reps    Other Lumbar Stretch Exercise  childs pose x 3 reps      Lumbar Exercises: Aerobic   Tread Mill  2.5 mph x 6 min  PTA present to discuss progress      Lumbar Exercises: Standing   Wall Slides  10 reps 5# in/out from core    Other Standing Lumbar Exercises  anti rotation with green band in and out from core x 10 reps each side; reverse wood chop with 4# in hands       Lumbar Exercises: Seated   Other Seated Lumbar Exercises  wood chop with green band while sitting on Green therapy ball x 15 some "tenderness in R LB" with Rt  rotation     Other Seated Lumbar Exercises  HIGH KNEELING:  ball roll outs with forearms on green ball x 10      Lumbar Exercises: Supine   Bridge  10 reps feet on green ball, arms at side.       Lumbar Exercises: Sidelying   Clam  Right;Left;10 reps;2 seconds core engaged, hips level.    Other Sidelying Lumbar Exercises  pilates leg circles CW/CCW x 12, core engaged-each side.       Modalities   Modalities  -- pt declined; will do at home if needed later         PT Long Term Goals - 01/07/18 0920      PT LONG TERM GOAL #1   Title  Improve core stability allowing patient to perform exercise and work activities without pain or discomfort 01/13/18    Time  6    Period  Weeks    Status  On-going      PT LONG TERM GOAL #2   Title  Improve lumbar ROM to WNL's and pain free 01/13/18    Time  6    Period  Weeks    Status  Achieved      PT LONG TERM GOAL #3   Title  Decrease pain by 75-100% allowing patient to return to all normal functional activities 01/13/18    Status  Achieved 80% reduction of pain      PT LONG TERM GOAL #4   Title  Independent in HEP and gym program  01/13/18    Time  6    Period  Weeks    Status  On-going      PT LONG TERM GOAL #5   Title  Improve FOTO to </= 39% limitation 07/05/17    Time  6    Period  Weeks    Status  On-going            Plan - 01/07/18 8466    Clinical Impression Statement  Pt tolerated all core exercises well, reporting no increase in pain or discomfort.  She has met LTG 2 and 3 and is making good gains towards remaining goals.     Rehab Potential  Good    PT Frequency  2x / week    PT Duration  6 weeks    PT Treatment/Interventions  Patient/family education;ADLs/Self Care Home Management;Cryotherapy;Electrical Stimulation;Iontophoresis 65m/ml Dexamethasone;Moist Heat;Ultrasound;Dry needling;Manual techniques;Neuromuscular re-education;Therapeutic activities;Therapeutic exercise    PT Next Visit Plan  assess readiness to d/c. FOTO    Consulted and Agree with Plan of Care  Patient       Patient will benefit from skilled therapeutic intervention in order to improve the following deficits and impairments:  Postural dysfunction, Improper body mechanics, Pain, Increased muscle spasms, Decreased range of motion, Decreased activity tolerance  Visit Diagnosis: Chronic bilateral low back pain without sciatica  Other symptoms and signs involving the musculoskeletal system     Problem List Patient Active Problem List   Diagnosis Date Noted  . DDD (degenerative disc disease), lumbar 12/05/2017  . Acute lumbar myofascial strain 05/21/2017  . Major depression in complete remission ([place] 05/11/2016  . GAD (generalized anxiety disorder) 01/09/2016   JKerin Perna PTA 01/07/18 10:34 AM  [place] [address]Phone: [phone]  Fax:  [phone] Name: Barbara ROCKHOLDMRN: [id number] of Birth: 4April 25, 1997

## 2018-01-07 NOTE — Patient Instructions (Signed)
Side Leg Circle    Lie on side, back straight along edge of mat, legs 30 in front of torso. Lift top leg to hip height. Rotate in small circle, __20__ times in each direction. Repeat __1-2__ times. Repeat on other side. Do __1__ sessions per day.  Strengthening: Wall Slide    Leaning on wall, slowly lower buttocks until thighs are parallel to floor. Hold ____ seconds. Tighten thigh muscles and return. Repeat ____ times per set. Do ____ sets per session. Do ____ sessions per day.  Wood Chop: Reverse - Sitting (Dumbbell)    Grasp dumbbell and rotate trunk by bringing hands above opposite shoulder. Keep pelvis stable. Repeat to other side. Do __1-2__ sets. Complete _10___ repetitions.

## 2018-01-14 ENCOUNTER — Encounter: Payer: Self-pay | Admitting: Physical Therapy

## 2018-01-14 ENCOUNTER — Encounter: Payer: Self-pay | Admitting: Family Medicine

## 2018-01-14 ENCOUNTER — Ambulatory Visit (INDEPENDENT_AMBULATORY_CARE_PROVIDER_SITE_OTHER): Payer: 59 | Admitting: Physical Therapy

## 2018-01-14 DIAGNOSIS — G8929 Other chronic pain: Secondary | ICD-10-CM

## 2018-01-14 DIAGNOSIS — M545 Low back pain, unspecified: Secondary | ICD-10-CM

## 2018-01-14 DIAGNOSIS — R29898 Other symptoms and signs involving the musculoskeletal system: Secondary | ICD-10-CM

## 2018-01-14 NOTE — Therapy (Addendum)
[place], [place], [address] Phone: [phone]   Fax:  [phone]  Physical Therapy Treatment  Patient Details  Name: Barbara Hooper MRN: [id number] Date of Birth: 09/30/1995 Referring Provider: Dr. Lynne Leader   Encounter Date: 01/14/2018  PT End of Session - 01/14/18 0939    Visit Number  11    Number of Visits  24    Date for PT Re-Evaluation  02/25/18    PT Start Time  0934    PT Stop Time  1027    PT Time Calculation (min)  53 min       Past Medical History:  Diagnosis Date  . Elevated blood pressure   . GAD (generalized anxiety disorder) 01/09/2016  . Major depression in complete remission ([place]) 05/11/2016    History reviewed. No pertinent surgical history.  There were no vitals filed for this visit.  Subjective Assessment - 01/14/18 0940    Subjective  Barbara Hooper reports she was moving furniture this weekend; has been having muscle spasms since then.  She is sore and "tender" on lower spine.  She has been doing her exercises 1x/day, stretching every day as well.   She no longer has "catch" with hamstring stretch.     Patient Stated Goals  get rid of pain     Currently in Pain?  Yes    Pain Score  2     Pain Location  Back    Pain Orientation  Right;Left;Lower    Pain Descriptors / Indicators  Tender    Aggravating Factors   prolonged sitting, especially without support.     Pain Relieving Factors  ice, walking, stretching          [facility] PT Assessment - 01/14/18 0001      Assessment   Medical Diagnosis  LBP     Referring Provider  Dr. Lynne Leader    Onset Date/Surgical Date  11/23/17    Hand Dominance  Right    Next MD Visit  PRN      Observation/Other Assessments   Focus on Therapeutic Outcomes (FOTO)   29% limited.          [facility] Treatment/Exercise - 01/14/18 0001      Lumbar Exercises: Stretches   Passive Hamstring Stretch  Right;Left;2 reps 45 sec     Press Ups  3 reps;10 reps    Piriformis Stretch  Right;Left;2 reps 45 sec hold    Other Lumbar Stretch Exercise  childs pose x 3 reps      Lumbar Exercises: Aerobic   Tread Mill  2.5 mph x 6 min  PTA present to discuss progress      Lumbar Exercises: Standing   Other Standing Lumbar Exercises  anti rotation with green band stepping in and out from wall x 10 reps each side;       Lumbar Exercises: Seated   Other Seated Lumbar Exercises  reverse wood chop with 4# in hands x 5 reps each side ,sitting on ball.       Lumbar Exercises: Supine   Bridge  10 reps feet on green ball, arms crossed.     Other Supine Lumbar Exercises  pilates 100 beginner (40 pumps x 3 reps) ;  pilates alternating single knee tucks (opp leg straight leg elevated over surface)      Lumbar Exercises: Quadruped   Opposite Arm/Leg Raise  Right arm/Left leg;Left arm/Right leg;10 reps;2 seconds with blue band  Cryotherapy   Number Minutes Cryotherapy  15 Minutes    Cryotherapy Location  Lumbar Spine    Type of Cryotherapy  Ice pack      Electrical Stimulation   Electrical Stimulation Location  bilat LBP, Rt buttock    Electrical Stimulation Action  TENS     Electrical Stimulation Parameters  to tolerance    Electrical Stimulation Goals  Pain                  PT Long Term Goals - 01/14/18 1255      PT LONG TERM GOAL #1   Title  Improve core stability allowing patient to perform exercise and work activities without pain or discomfort 02/25/18    Time  18    Period  Weeks    Status  Revised      PT LONG TERM GOAL #2   Title  Improve lumbar ROM to WNL's and pain free 01/13/18    Time  6    Period  Weeks    Status  Achieved      PT LONG TERM GOAL #3   Title  Decrease pain by 75-100% allowing patient to return to all normal functional activities 01/13/18    Time  6    Period  Weeks    Status  Achieved      PT LONG TERM GOAL #4   Title  Independent in HEP and gym program 02/25/18    Time  18     Period  Weeks    Status  Revised      PT LONG TERM GOAL #5   Title  Improve FOTO to </= 39% limitation 07/05/17    Time  6    Status  Achieved            Plan - 01/14/18 1256    Clinical Impression Statement  Pt tolerated all core exercises well, without increase in pain in LB. She has met her FOTO goal.  She no longer has a catch in her low back with supine hamstring stretch.   She has partially met her goals.  She requests to hold therapy for 2 wks while she works on her HEP. We will continue with core stabilization and strengthening as indicated.     Rehab Potential  Good    PT Frequency  2x / week    PT Duration  6 weeks    PT Treatment/Interventions  Patient/family education;ADLs/Self Care Home Management;Cryotherapy;Electrical Stimulation;Iontophoresis 84m/ml Dexamethasone;Moist Heat;Ultrasound;Dry needling;Manual techniques;Neuromuscular re-education;Therapeutic activities;Therapeutic exercise    PT Next Visit Plan  spoke to supervising PT; will hold therapy for 2 wks. Continue with core stabilization and strengthening as indicated     Consulted and Agree with Plan of Care  Patient       Patient will benefit from skilled therapeutic intervention in order to improve the following deficits and impairments:  Postural dysfunction, Improper body mechanics, Pain, Increased muscle spasms, Decreased range of motion, Decreased activity tolerance  Visit Diagnosis: Chronic bilateral low back pain without sciatica - Plan: PT plan of care cert/re-cert  Other symptoms and signs involving the musculoskeletal system - Plan: PT plan of care cert/re-cert  Acute bilateral low back pain without sciatica - Plan: PT plan of care cert/re-cert     Problem List Patient Active Problem List   Diagnosis Date Noted  . DDD (degenerative disc disease), lumbar 12/05/2017  . Acute lumbar myofascial strain 05/21/2017  . Major depression in complete remission ([place] 05/11/2016  .  GAD (generalized  anxiety disorder) 01/09/2016   Kerin Perna, PTA 01/14/18 1:10 PM   Celyn P. Helene Kelp PT, MPH 01/14/18 1:11 PM    [facility] [place], [place], [address] Phone: [phone]   Fax:  [phone]  Name: Barbara Hooper MRN: [id number] Date of Birth: Dec 15, 1995   PHYSICAL THERAPY DISCHARGE SUMMARY  Visits from Start of Care: 11  Current functional level related to goals / functional outcomes: See progress noted for discharge status   Remaining deficits: Unknown    Education / Equipment: HEP  Plan: Patient agrees to discharge.  Patient goals were met. Patient is being discharged due to meeting the stated rehab goals.  ?????     Celyn P. Helene Kelp PT, MPH 02/26/18 3:25 PM

## 2018-04-15 MED FILL — NITROFURANTOIN MONO-MCR 100: 100 | 10 days supply | Qty: 20 | Fill #0

## 2018-04-15 MED FILL — HYDROCODON-APAP 5-325: 5-325 | 2 days supply | Qty: 10 | Fill #0

## 2018-04-16 ENCOUNTER — Telehealth: Payer: Self-pay | Admitting: Family Medicine

## 2018-04-16 DIAGNOSIS — N912 Amenorrhea, unspecified: Secondary | ICD-10-CM

## 2018-04-16 NOTE — Telephone Encounter (Signed)
Patient has been informed. Furqan Gosselin,CMA  

## 2018-04-16 NOTE — Telephone Encounter (Signed)
PT requested blood work to be sent in down stairs.   She had two positive pregnancy tests at a clinic.  Please contact PT to figure out the best pathway.

## 2018-04-16 NOTE — Telephone Encounter (Signed)
Patient had urine pregnancy test prior to lumbar x-ray at office for Worker's Comp. back injury.  The test is very faintly possibly positive.  There is some uncertainty as to this is a really positive test or not. Barbara Hooper uses Nexplanon for birth control that was placed less than a year ago.  She typically does not have periods.  Plan for serum hCG.

## 2018-04-17 LAB — HCG, QUANTITATIVE, PREGNANCY: HCG, Total, QN: <2 m[IU]/mL

## 2018-04-24 MED FILL — IBUPROFEN 800 MG TAB: 800 | 7 days supply | Qty: 21 | Fill #0

## 2018-04-24 MED FILL — BACLOFEN 10 MG TABLET: 10 | 7 days supply | Qty: 21 | Fill #0

## 2018-09-30 IMAGING — MR MR LUMBAR SPINE W/O CM
4 of 5 series · 25 of 48 positions shown · non-contrast
Comparison: Lumbar radiographs 11/26/2017.

CLINICAL DATA: 22-year-old female with persistent lumbar back pain
for 6 months despite conservative treatment. On set while moving a
patient in [REDACTED], felt a pop. Pain when sitting.

EXAM:
MRI LUMBAR SPINE WITHOUT CONTRAST
TECHNIQUE: Multiplanar, multisequence MR imaging of the lumbar spine was
performed. No intravenous contrast was administered.

[Series 2: T2 · sagittal · 4.0mm · 0.81mm/px · 6 of 15 slices shown (1 of 2)]
[im 1/15]
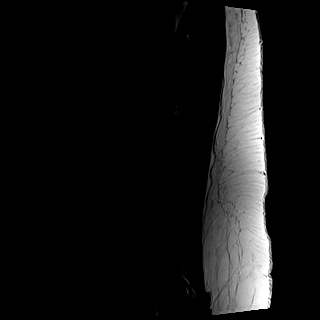
[im 3/15]
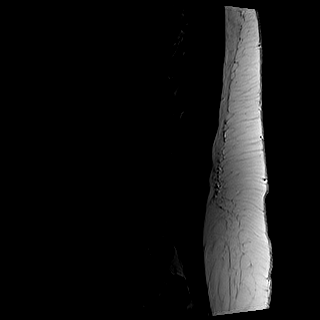
[im 6/15]
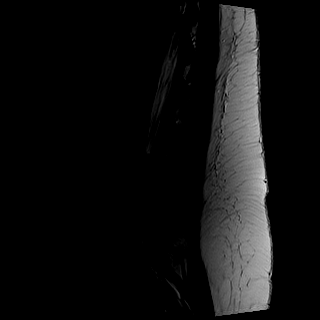
[im 9/15]
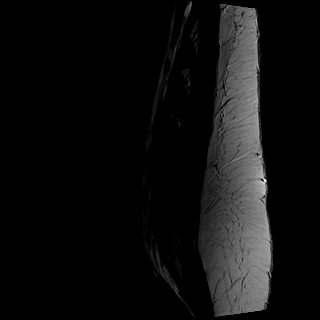
[im 12/15]
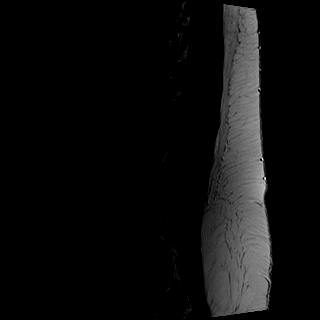
[im 15/15]
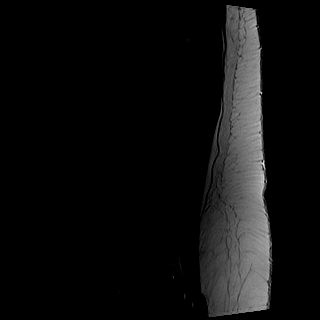

[Series 3: T1 · sagittal · 4.0mm · 0.41mm/px · 6 of 15 slices shown (1 of 2)]
[im 1/15]
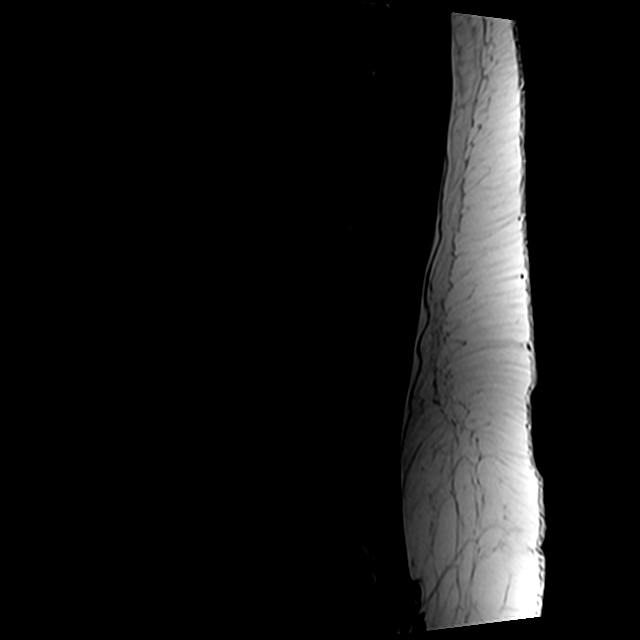
[im 3/15]
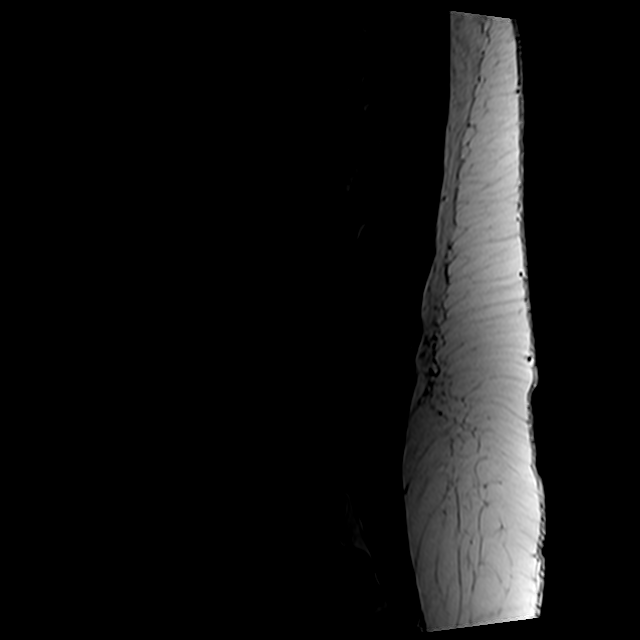
[im 6/15]
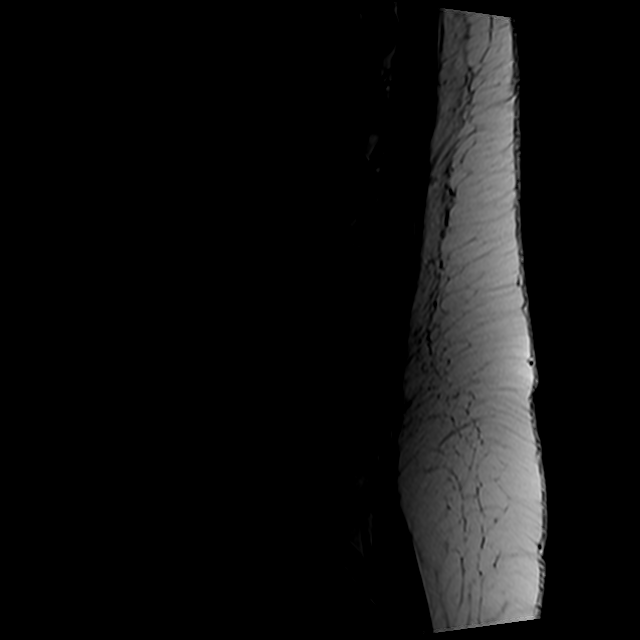
[im 9/15]
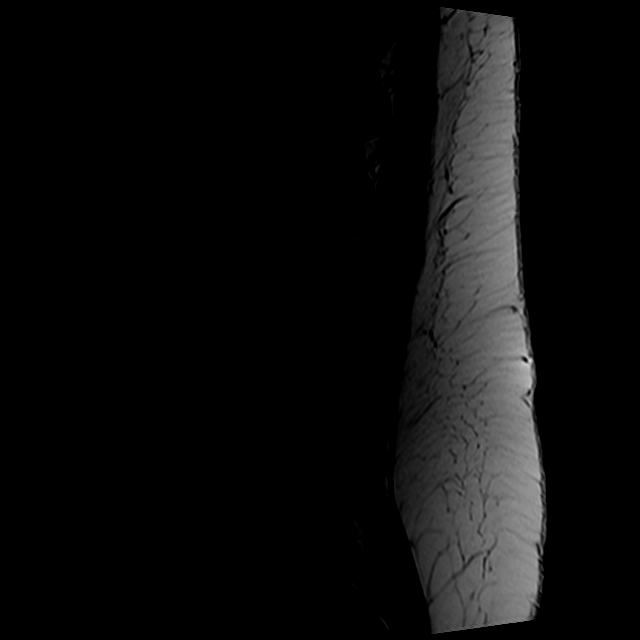
[im 12/15]
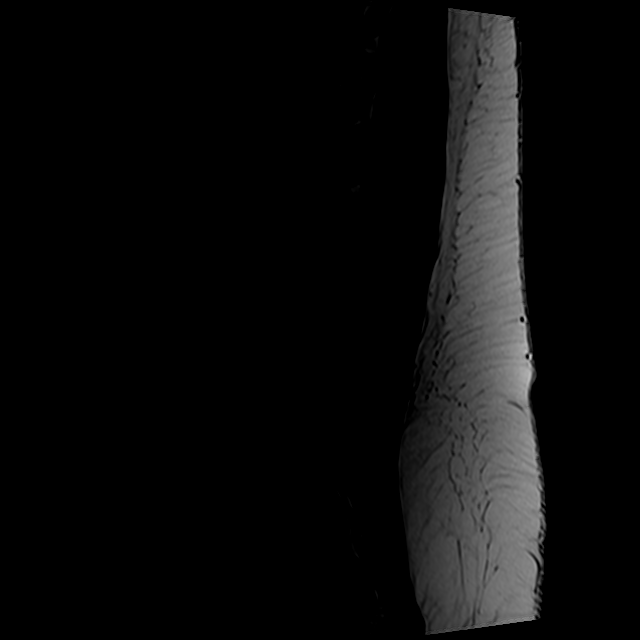
[im 15/15]
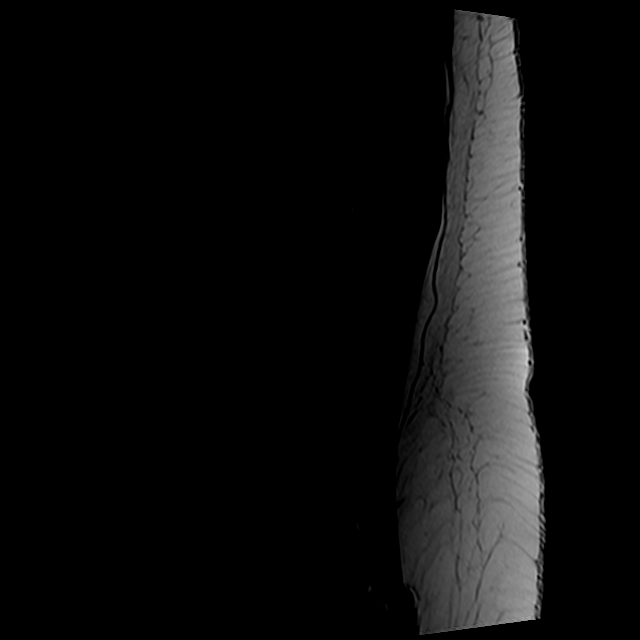

[Series 5: T2 · axial · 4.0mm · 0.78mm/px · z∈[-82,+122]mm · 9 of 37 slices shown (2 of 2)]
[im 1/37]
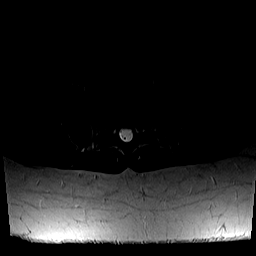
[im 6/37]
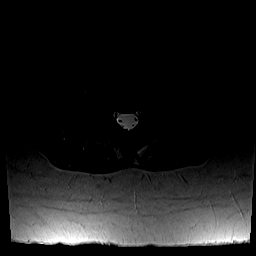
[im 11/37]
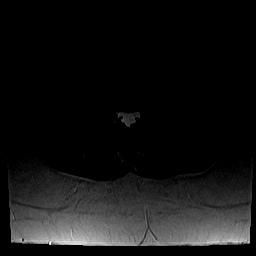
[im 16/37]
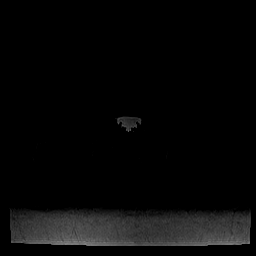
[im 19/37]
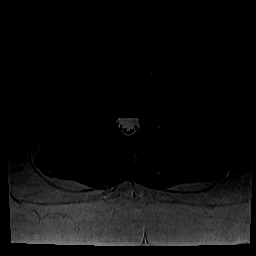
[im 21/37]
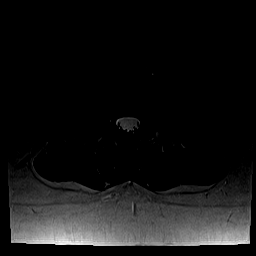
[im 26/37]
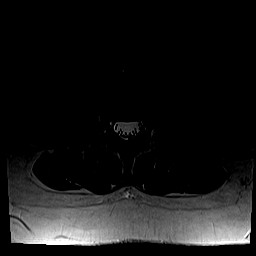
[im 31/37]
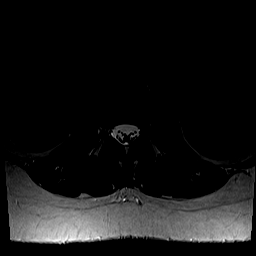
[im 37/37]
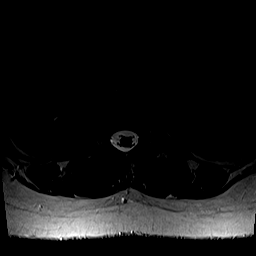

[Series 6: T1 · axial · 4.0mm · 0.39mm/px · z∈[-82,+92]mm · 4 of 37 slices shown (2 of 2)]
[im 1/37]
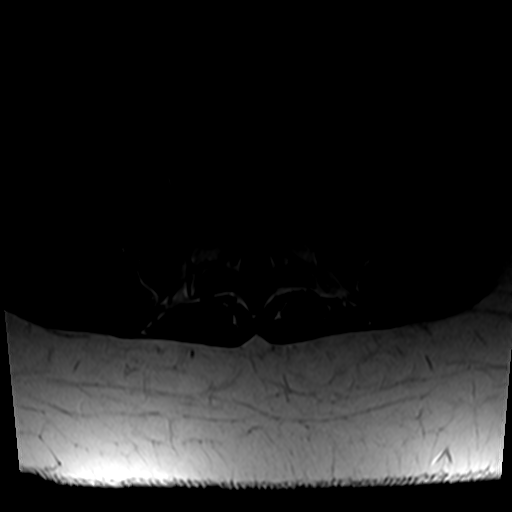
[im 6/37]
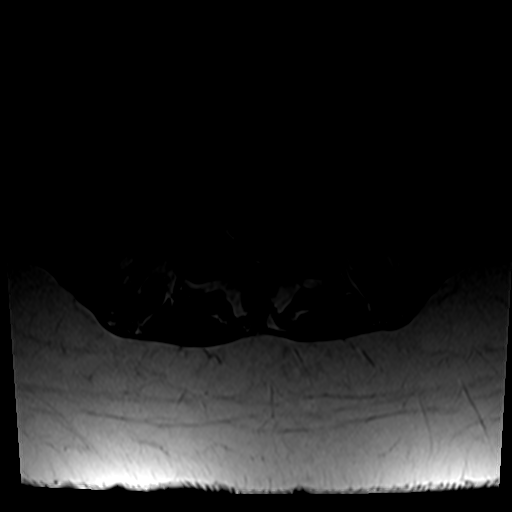
[im 19/37]
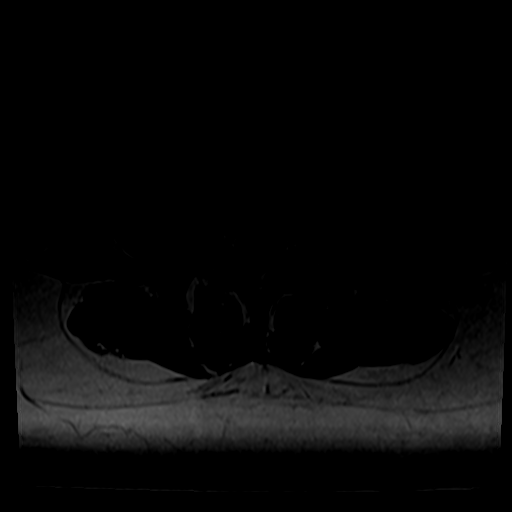
[im 31/37]
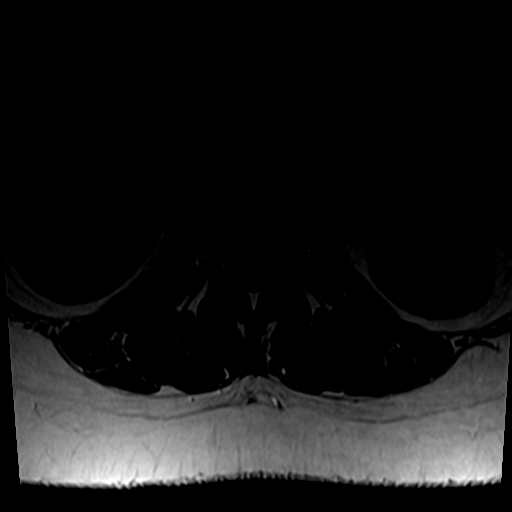

[25 of 48 positions shown; findings below may reference images not displayed]

FINDINGS: Segmentation: For the purposes of this report hypoplastic ribs are
designated at T12 with full size ribs at T11 and normal lumbar spine
segmentation. This is the same numbering system used on the
comparison radiographs, and appears to agree with the [place]htar
ligament configuration which suggests L5 is the level on series 5,
image 28.

Alignment: Stable mild straightening of lumbar lordosis. No
spondylolisthesis.

Vertebrae: No marrow edema or evidence of acute osseous abnormality.
Visualized bone marrow signal is within normal limits. Intact
visible sacrum and SI joints.

Conus medullaris and cauda equina: Conus extends to the L1-L2 level.
Conus and cauda equina appear normal.

Paraspinal and other soft tissues: Negative.

Disc levels:

T11-T12: Negative.

T12-L1:  Negative.

L1-L2:  Negative.

L2-L3: Subtle central disc protrusion as seen on series 5, image 13.
No stenosis and otherwise negative.

L3-L4: Mild disc desiccation and circumferential disc bulge. Subtle
left paracentral annular fissure. Mild facet hypertrophy. No
stenosis.

L4-L5: Disc desiccation. Small but broad-based right paracentral
disc protrusion (series 2, image 8 and series 5, image 26). Mild
facet hypertrophy. No spinal stenosis or convincing neural
impingement.

L5-S1:  Negative.
IMPRESSION: 1. Mild lumbar disc degeneration L2-L3 through L4-L5, most
pronounced at the latter with a small right paracentral disc
herniation. No associated spinal stenosis or convincing neural
impingement.
2. No osseous abnormality. Hypoplastic ribs at T12 and normal lumbar
segmentation designated as on the comparison radiographs.

## 2018-11-26 ENCOUNTER — Encounter: Payer: Self-pay | Admitting: Family Medicine

## 2018-11-26 LAB — C. TRACHOMATIS / N. GONORRHOEAE, DNA PROBE
Chlamydia DNA by PCR: NEGATIVE
Neisseria Gonorrhoeae RNA: NEGATIVE

## 2018-11-26 LAB — HEPATITIS PANEL, ACUTE
Hep A Ab, IgM: NEGATIVE
Hep B Core Ab, IgM: NEGATIVE
Hepatitis B Surface Antigen: NEGATIVE
Hepatitis C Ab: NEGATIVE

## 2018-11-26 LAB — RPR: RPR: NONREACTIVE

## 2018-11-26 NOTE — Progress Notes (Signed)
Abstracted labs from OB/GYN from February 2019.  Hepatitis panel was negative, HIV RPR gonorrhea and Chlamydia were negative.  Pap smear was performed and negative.  Pap smear will be sent to abstract.

## 2019-01-08 DIAGNOSIS — H5203 Hypermetropia, bilateral: Secondary | ICD-10-CM | POA: Diagnosis not present

## 2019-01-08 DIAGNOSIS — Z135 Encounter for screening for eye and ear disorders: Secondary | ICD-10-CM | POA: Diagnosis not present

## 2019-01-08 DIAGNOSIS — H52223 Regular astigmatism, bilateral: Secondary | ICD-10-CM | POA: Diagnosis not present

## 2019-02-05 ENCOUNTER — Encounter: Payer: Self-pay | Admitting: Family Medicine

## 2019-08-26 DIAGNOSIS — Z20822 Contact with and (suspected) exposure to covid-19: Secondary | ICD-10-CM | POA: Diagnosis not present

## 2020-02-16 ENCOUNTER — Telehealth: Payer: 59 | Admitting: Emergency Medicine

## 2020-02-16 DIAGNOSIS — J069 Acute upper respiratory infection, unspecified: Secondary | ICD-10-CM

## 2020-02-16 MED ORDER — FLUTICASONE PROPIONATE 50 MCG/ACT NA SUSP: 2.0000 | Freq: Every day | NASAL | 0 refills | Status: DC

## 2020-02-16 MED ORDER — BENZONATATE 100 MG PO CAPS: 100.0000 mg | ORAL_CAPSULE | Freq: Two times a day (BID) | ORAL | 0 refills | Status: DC | PRN

## 2020-02-16 NOTE — Progress Notes (Signed)

## 2020-07-18 ENCOUNTER — Telehealth: Payer: 59 | Admitting: Physician Assistant

## 2020-07-18 DIAGNOSIS — J069 Acute upper respiratory infection, unspecified: Secondary | ICD-10-CM

## 2020-07-18 MED ORDER — BENZONATATE 100 MG PO CAPS: 100.0000 mg | ORAL_CAPSULE | Freq: Three times a day (TID) | ORAL | 0 refills | Status: DC | PRN

## 2020-07-18 MED ORDER — FLUTICASONE PROPIONATE 50 MCG/ACT NA SUSP: 2.0000 | Freq: Every day | NASAL | 0 refills | Status: DC

## 2020-07-18 NOTE — Progress Notes (Signed)
We are sorry you are not feeling well.  Here is how we plan to help!  In addition to the medications to help your symptoms, I will also write a work note.  Based on what you have shared with me, it looks like you may have a viral upper respiratory infection.  Upper respiratory infections are caused by a large number of viruses; however, rhinovirus is the most common cause.   Symptoms vary from person to person, with common symptoms including sore throat, cough, fatigue or lack of energy and feeling of general discomfort.  A low-grade fever of up to 100.4 may present, but is often uncommon.  Symptoms vary however, and are closely related to a person's age or underlying illnesses.  The most common symptoms associated with an upper respiratory infection are nasal discharge or congestion, cough, sneezing, headache and pressure in the ears and face.  These symptoms usually persist for about 3 to 10 days, but can last up to 2 weeks.  It is important to know that upper respiratory infections do not cause serious illness or complications in most cases.    Upper respiratory infections can be transmitted from person to person, with the most common method of transmission being a person's hands.  The virus is able to live on the skin and can infect other persons for up to 2 hours after direct contact.  Also, these can be transmitted when someone coughs or sneezes; thus, it is important to cover the mouth to reduce this risk.  To keep the spread of the illness at bay, good hand hygiene is very important.  This is an infection that is most likely caused by a virus. There are no specific treatments other than to help you with the symptoms until the infection runs its course.  We are sorry you are not feeling well.  Here is how we plan to help!   For nasal congestion, you may use an oral decongestants such as Mucinex D or if you have glaucoma or high blood pressure use plain Mucinex.  Saline nasal spray or nasal drops  can help and can safely be used as often as needed for congestion.  For your congestion, I have prescribed Fluticasone nasal spray one spray in each nostril twice a day  If you do not have a history of heart disease, hypertension, diabetes or thyroid disease, prostate/bladder issues or glaucoma, you may also use Sudafed to treat nasal congestion.  It is highly recommended that you consult with a pharmacist or your primary care physician to ensure this medication is safe for you to take.     If you have a cough, you may use cough suppressants such as Delsym and Robitussin.  If you have glaucoma or high blood pressure, you can also use Coricidin HBP.   For cough I have prescribed for you A prescription cough medication called Tessalon Perles 100 mg. You may take 1-2 capsules every 8 hours as needed for cough  If you have a sore or scratchy throat, use a saltwater gargle-  to  teaspoon of salt dissolved in a 4-ounce to 8-ounce glass of warm water.  Gargle the solution for approximately 15-30 seconds and then spit.  It is important not to swallow the solution.  You can also use throat lozenges/cough drops and Chloraseptic spray to help with throat pain or discomfort.  Warm or cold liquids can also be helpful in relieving throat pain.  For headache, pain or general discomfort, you can use  Ibuprofen or Tylenol as directed.   Some authorities believe that zinc sprays or the use of Echinacea may shorten the course of your symptoms.   HOME CARE . Only take medications as instructed by your medical team. . Be sure to drink plenty of fluids. Water is fine as well as fruit juices, sodas and electrolyte beverages. You may want to stay away from caffeine or alcohol. If you are nauseated, try taking small sips of liquids. How do you know if you are getting enough fluid? Your urine should be a pale yellow or almost colorless. . Get rest. . Taking a steamy shower or using a humidifier may help nasal congestion and  ease sore throat pain. You can place a towel over your head and breathe in the steam from hot water coming from a faucet. . Using a saline nasal spray works much the same way. . Cough drops, hard candies and sore throat lozenges may ease your cough. . Avoid close contacts especially the very young and the elderly . Cover your mouth if you cough or sneeze . Always remember to wash your hands.   GET HELP RIGHT AWAY IF: . You develop worsening fever. . If your symptoms do not improve within 10 days . You develop yellow or green discharge from your nose over 3 days. . You have coughing fits . You develop a severe head ache or visual changes. . You develop shortness of breath, difficulty breathing or start having chest pain . Your symptoms persist after you have completed your treatment plan  MAKE SURE YOU   Understand these instructions.  Will watch your condition.  Will get help right away if you are not doing well or get worse.  Your e-visit answers were reviewed by a board certified advanced clinical practitioner to complete your personal care plan. Depending upon the condition, your plan could have included both over the counter or prescription medications. Please review your pharmacy choice. If there is a problem, you may call our nursing hot line at and have the prescription routed to another pharmacy. Your safety is important to [place]. If you have drug allergies check your prescription carefully.   You can use MyChart to ask questions about today's visit, request a non-urgent call back, or ask for a work or school excuse for 24 hours related to this e-Visit. If it has been greater than 24 hours you will need to follow up with your provider, or enter a new e-Visit to address those concerns. You will get an e-mail in the next two days asking about your experience.  I hope that your e-visit has been valuable and will speed your recovery. Thank you for using e-visits.     Greater than 5  minutes, yet less than 10 minutes of time have been spent researching, coordinating, and implementing care for this patient today

## 2020-08-04 ENCOUNTER — Other Ambulatory Visit: Payer: 59

## 2020-08-04 DIAGNOSIS — Z20822 Contact with and (suspected) exposure to covid-19: Secondary | ICD-10-CM | POA: Diagnosis not present

## 2020-08-06 LAB — NOVEL CORONAVIRUS, NAA: SARS-CoV-2, NAA: NOT DETECTED

## 2020-08-06 LAB — SARS-COV-2, NAA 2 DAY TAT

## 2020-08-09 ENCOUNTER — Emergency Department (INDEPENDENT_AMBULATORY_CARE_PROVIDER_SITE_OTHER)
Admission: EM | Admit: 2020-08-09 | Discharge: 2020-08-09 | Disposition: A | Discharge status: Home / Self Care | Payer: 59 | Source: Home / Self Care

## 2020-08-09 ENCOUNTER — Other Ambulatory Visit: Payer: Self-pay

## 2020-08-09 DIAGNOSIS — J069 Acute upper respiratory infection, unspecified: Secondary | ICD-10-CM

## 2020-08-09 MED ORDER — PREDNISONE 20 MG PO TABS: 40.0000 mg | ORAL_TABLET | Freq: Every day | ORAL | 0 refills | Status: DC

## 2020-08-09 MED ORDER — LEVOCETIRIZINE DIHYDROCHLORIDE 5 MG PO TABS: 5.0000 mg | ORAL_TABLET | Freq: Every evening | ORAL | 0 refills | Status: DC

## 2020-08-09 NOTE — ED Provider Notes (Signed)
[facility]    CSN: [id number] Arrival date & time: 08/09/20  1651      History   Chief Complaint Chief Complaint  Patient presents with  . URI    HPI Barbara Hooper is a 25 y.o. female.   HPI  Patient presents today for evaluation of residual nasal congestion and throat irritation following viral flu/COVID like illness patient experienced around 07/19/2020.  Patient was tested for COVID in December and subsequently again 1 week ago both test were negative.  She reports residual lingering unresolved symptoms of persistent nasal congestion and throat irritation and an occasional cough.  She has been taking Mucinex DM and using Flonase however stopped when symptoms seem to slightly improve however symptoms have returned and she is concerned for worsening illness.   Past Medical History:  Diagnosis Date  . Elevated blood pressure   . GAD (generalized anxiety disorder) 01/09/2016  . Major depression in complete remission (HCC) 05/11/2016    Patient Active Problem List   Diagnosis Date Noted  . DDD (degenerative disc disease), lumbar 12/05/2017  . Acute lumbar myofascial strain 05/21/2017  . Major depression in complete remission (HCC) 05/11/2016  . GAD (generalized anxiety disorder) 01/09/2016    History reviewed. No pertinent surgical history.  OB History   No obstetric history on file.      Home Medications    Prior to Admission medications   Medication Sig Start Date End Date Taking? Authorizing Provider  benzonatate (TESSALON PERLES) 100 MG capsule Take 1 capsule (100 mg total) by mouth 3 (three) times daily as needed for cough (cough). 07/18/20   Muthersbaugh, Dahlia Client, PA-C  cyclobenzaprine (FLEXERIL) 10 MG tablet Take 0.5-1 tablets (5-10 mg total) by mouth at bedtime as needed for muscle spasms. 05/21/17   Carlis Stable, PA-C  etonogestrel (NEXPLANON) 68 MG IMPL implant 1 each once by Subdermal route.    [provider]   fluticasone (FLONASE) 50 MCG/ACT nasal spray Place 2 sprays into both nostrils daily. 07/18/20   Muthersbaugh, Dahlia Client, PA-C  metroNIDAZOLE (FLAGYL) 500 MG tablet Take 1 tablet (500 mg total) by mouth 2 (two) times daily. 12/05/17   Rodolph Bong, MD  SUMAtriptan (IMITREX) 50 MG tablet Take 1 tablet (50 mg total) by mouth every 2 (two) hours as needed for migraine. May repeat in 2 hours if headache persists or recurs. 03/19/17   Rodolph Bong, MD    Family History Family History  Problem Relation Age of Onset  . Hypertension Mother   . Hypertension Father   . Cancer Maternal Aunt   . Cancer Maternal Uncle   . Depression Maternal Grandmother     Social History Social History   Tobacco Use  . Smoking status: Never Smoker  . Smokeless tobacco: Never Used  Vaping Use  . Vaping Use: Never used  Substance Use Topics  . Alcohol use: No     Allergies   Patient has no known allergies.   Review of Systems Review of Systems Pertinent negatives listed in HPI   Physical Exam Triage Vital Signs ED Triage Vitals  Enc Vitals Group     BP 08/09/20 1830 (!) 167/105     Pulse Rate 08/09/20 1830 83     Resp 08/09/20 1830 18     Temp 08/09/20 1830 98.8 F (37.1 C)     Temp Source 08/09/20 1830 Oral     SpO2 08/09/20 1830 97 %     Weight --  Height --      Head Circumference --      Peak Flow --      Pain Score 08/09/20 1831 0     Pain Loc --      Pain Edu? --      Excl. in GC? --    No data found.  Updated Vital Signs BP (!) 167/105 (BP Location: Right Arm)   Pulse 83   Temp 98.8 F (37.1 C) (Oral)   Resp 18   SpO2 97%   Visual Acuity Right Eye Distance:   Left Eye Distance:   Bilateral Distance:    Right Eye Near:   Left Eye Near:    Bilateral Near:     Physical Exam  General Appearance:    Alert, cooperative, no distress  HENT:   Normocephalic, ears normal, nares mucosal edema with congestion, rhinorrhea, oropharynx w/o exudate or erythema  Eyes:     PERRL, conjunctiva/corneas clear, EOM's intact       Lungs:     Clear to auscultation bilaterally, respirations unlabored  Heart:    Regular rate and rhythm  Neurologic:   Awake, alert, oriented x 3. No apparent focal neurological           defect.      UC Treatments / Results  Labs (all labs ordered are listed, but only abnormal results are displayed) Labs Reviewed - No data to display  EKG   Radiology No results found.  Procedures Procedures (including critical care time)  Medications Ordered in UC Medications - No data to display  Initial Impression / Assessment and Plan / UC Course  I have reviewed the triage vital signs and the nursing notes.  Pertinent labs & imaging results that were available during my care of the patient were reviewed by me and considered in my medical decision making (see chart for details).    Acute viral upper respiratory illness with occasional cough.  We will treat with a course of prednisone and manage nasal symptoms with levocetirizine and resume Flonase.  Suspect cough is mainly related to persistent postnasal drainage secondary to use nasal congestion.  Patient had a negative COVID test less than a week ago therefore will not repeat today.  She is afebrile. Final Clinical Impressions(s) / UC Diagnoses   Final diagnoses:  Viral URI with cough     Discharge Instructions     Start prednisone tomorrow with breakfast and take a total 5 days for nasal congestion symptoms and throat irritation.  At bedtime start Xyzal 5 mg and continue taking until nasal symptoms completely resolved.    ED Prescriptions    Medication Sig Dispense Auth. Provider   predniSONE (DELTASONE) 20 MG tablet Take 2 tablets (40 mg total) by mouth daily with breakfast. 10 tablet Bing Neighbors, FNP   levocetirizine (XYZAL) 5 MG tablet Take 1 tablet (5 mg total) by mouth every evening. 30 tablet Bing Neighbors, FNP     PDMP not reviewed this encounter.    Bing Neighbors, FNP 08/09/20 1956

## 2020-08-09 NOTE — ED Triage Notes (Signed)
Pt c/o intermittent cold sxs since 12/20. Had fever and cough, tested neg for COVID on 01/06. C/o continued congestion and post nasal drip.   States she started feeling some throat swelling that started yesterday. Hard to swallow. Lots of mucous build up. Mucinex prn.

## 2020-08-09 NOTE — Discharge Instructions (Signed)
Start prednisone tomorrow with breakfast and take a total 5 days for nasal congestion symptoms and throat irritation.  At bedtime start Xyzal 5 mg and continue taking until nasal symptoms completely resolved.

## 2020-08-11 ENCOUNTER — Other Ambulatory Visit: Payer: 59

## 2020-08-29 ENCOUNTER — Other Ambulatory Visit: Payer: Self-pay | Admitting: Family Medicine

## 2020-08-31 ENCOUNTER — Other Ambulatory Visit: Payer: Self-pay

## 2020-08-31 ENCOUNTER — Encounter: Payer: Self-pay | Admitting: Family Medicine

## 2020-08-31 ENCOUNTER — Ambulatory Visit (INDEPENDENT_AMBULATORY_CARE_PROVIDER_SITE_OTHER): Payer: 59 | Admitting: Family Medicine

## 2020-08-31 VITALS — BP 150/90 | HR 97 | Temp 98.2°F | Ht 63.78 in | Wt 193.3 lb

## 2020-08-31 DIAGNOSIS — Z13220 Encounter for screening for lipoid disorders: Secondary | ICD-10-CM

## 2020-08-31 DIAGNOSIS — F411 Generalized anxiety disorder: Secondary | ICD-10-CM

## 2020-08-31 DIAGNOSIS — F321 Major depressive disorder, single episode, moderate: Secondary | ICD-10-CM

## 2020-08-31 DIAGNOSIS — Z0000 Encounter for general adult medical examination without abnormal findings: Secondary | ICD-10-CM

## 2020-08-31 DIAGNOSIS — R5383 Other fatigue: Secondary | ICD-10-CM | POA: Diagnosis not present

## 2020-08-31 MED ORDER — ESCITALOPRAM OXALATE 10 MG PO TABS: ORAL_TABLET | ORAL | 3 refills | Status: DC

## 2020-08-31 NOTE — Patient Instructions (Signed)
Great to meet you today! Please schedule an appointment with Dr. Lyn Hollingshead for updated pap and nexplanon removal See me again in about 4 weeks for annual exam and follow up of lexapro.  Please call or send mychart message with any questions or concern.  Look at [facility] for counseling/therapy-[email or web address]

## 2020-08-31 NOTE — Progress Notes (Signed)
Barbara Hooper - 25 y.o. female MRN [id number]  Date of birth: Feb 16, 1996  Subjective Chief Complaint  Patient presents with  . Establish Care  . Anxiety  . Depression    HPI Barbara Hooper is a 25 y.o. female here today to re-establish care with out practice.  Former patient of Dr. Denyse Amass.  She has concerns about depression and anxiety that she would like to discuss today.  Reports decreased interested, energy levels, poor appetite, concentration difficulties and anxiety.  This has been going on for several months.  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  She is somewhat hesitant to start medication due to experience with sertraline previously.  She took this for about a 6 month period however states that she didn't like this because she felt like a "zombie" with numbness of emotions.  Bupropion was added which helped some.  She eventually dropped sertraline before stopping bupropion cold [place].  She reports that she and her partner are doing couples counseling but she is not doing individual counseling.  She had seen Terri Bauert in [place] previously but insurance stopped covering this.   She also has nexplanon that is due to be removed.  Plans to schedule with Dr. Lyn Hollingshead for this and PAP.   Depression screen [facility] 2/9 08/31/2020 11/26/2017 03/19/2017  Decreased Interest 3 0 1  Down, Depressed, Hopeless 2 0 1  PHQ - 2 Score 5 0 2  Altered sleeping 1 0 3  Tired, decreased energy 2 0 1  Change in appetite 2 0 1  Feeling bad or failure about yourself  2 0 0  Trouble concentrating 3 0 0  Moving slowly or fidgety/restless 1 0 0  Suicidal thoughts 0 0 0  PHQ-9 Score 16 0 7  Difficult doing work/chores Very difficult - Somewhat difficult   GAD 7 : Generalized Anxiety Score 08/31/2020 05/11/2016 04/23/2016 02/20/2016  Nervous, Anxious, on Edge 3 1 2 1   Control/stop worrying 2 0 1 1  Worry too much - different things 3 1 1 2   Trouble  relaxing 3 1 1 1   Restless 2 0 1 1  Easily annoyed or irritable 2 0 3 1  Afraid - awful might happen 3 0 1 0  Total GAD 7 Score 18 3 10 7   Anxiety Difficulty Very difficult Not difficult at all Somewhat difficult Somewhat difficult   ROS:  A comprehensive ROS was completed and negative except as noted per HPI   No Known Allergies  Past Medical History:  Diagnosis Date  . Elevated blood pressure   . GAD (generalized anxiety disorder) 01/09/2016  . Major depression in complete remission (HCC) 05/11/2016    History reviewed. No pertinent surgical history.  Social History   Socioeconomic History  . Marital status: Single    Spouse name: Not on file  . Number of children: Not on file  . Years of education: Not on file  . Highest education level: Not on file  Occupational History  . Not on file  Tobacco Use  . Smoking status: Never Smoker  . Smokeless tobacco: Never Used  Vaping Use  . Vaping Use: Some days  . Substances: Nicotine  Substance and Sexual Activity  . Alcohol use: Yes    Alcohol/week: 3.0 standard drinks    Types: 3 Glasses of wine per week    Comment: WEEKLY  . Drug use: Yes    Types: Marijuana  Comment: OCCASIONALLY  . Sexual activity: Yes    Partners: Male    Birth control/protection: Implant    Comment: Nexplanon  Other Topics Concern  . Not on file  Social History Narrative  . Not on file   Social Determinants of Health   Financial Resource Strain: Not on file  Food Insecurity: Not on file  Transportation Needs: Not on file  Physical Activity: Not on file  Stress: Not on file  Social Connections: Not on file    Family History  Problem Relation Age of Onset  . Hypertension Mother   . Skin cancer Mother   . Hypertension Father   . Cancer Maternal Aunt   . Cancer Maternal Uncle   . Depression Maternal Grandmother   . Breast cancer Paternal Aunt   . Heart attack Paternal Grandfather   . Heart disease Neg Hx     Health Maintenance   Topic Date Due  . COVID-19 Vaccine (3 - Booster for Pfizer series) 05/01/2020  . PAP-Cervical Cytology Screening  09/25/2020  . PAP SMEAR-Modifier  09/25/2020  . INFLUENZA VACCINE  10/27/2020 (Originally 02/28/2020)  . TETANUS/TDAP  06/05/2027  . Hepatitis C Screening  Completed  . HIV Screening  Completed     ----------------------------------------------------------------------------------------------------------------------------------------------------------------------------------------------------------------- Physical Exam BP (!) 150/90 (BP Location: Left Arm, Patient Position: Sitting, Cuff Size: Large)   Pulse 97   Temp 98.2 F (36.8 C)   Ht 5' 3.78" (1.62 m)   Wt 193 lb 4.8 oz (87.7 kg)   SpO2 98%   BMI 33.41 kg/m   Physical Exam Constitutional:      Appearance: Normal appearance.  HENT:     Head: Normocephalic and atraumatic.  Musculoskeletal:     Cervical back: Neck supple.  Neurological:     General: No focal deficit present.     Mental Status: She is alert.  Psychiatric:        Mood and Affect: Mood normal.        Behavior: Behavior normal.     ------------------------------------------------------------------------------------------------------------------------------------------------------------------------------------------------------------------- Assessment and Plan  Major depression Depression with significant anxiety as well.  She is open to trying another medication.  Will try lexapro as I think this would be beneficial for her anxiety as well.  Start at 5mg  x1 week then increase to 10mg  daily.  She has insurance through [place] and discussed benefit of counseling available through [facility].  Can place referral to in person counseling if desired.  I'll plan to see her back in 1 month.    Meds ordered this encounter  Medications  . escitalopram (LEXAPRO) 10 MG tablet    Sig: Take 1/2 tab x7 days then increase to whole tab    Dispense:  30 tablet     Refill:  3    Return in about 4 weeks (around 09/28/2020) for Annual exam.    This visit occurred during the SARS-CoV-2 public health emergency.  Safety protocols were in place, including screening questions prior to the visit, additional usage of staff PPE, and extensive cleaning of exam room while observing appropriate contact time as indicated for disinfecting solutions.

## 2020-08-31 NOTE — Assessment & Plan Note (Signed)
Depression with significant anxiety as well.  She is open to trying another medication.  Will try lexapro as I think this would be beneficial for her anxiety as well.  Start at 5mg  x1 week then increase to 10mg  daily.  She has insurance through [place] and discussed benefit of counseling available through [facility].  Can place referral to in person counseling if desired.  I'll plan to see her back in 1 month.

## 2020-09-13 ENCOUNTER — Other Ambulatory Visit: Payer: Self-pay

## 2020-09-13 ENCOUNTER — Encounter: Payer: Self-pay | Admitting: Osteopathic Medicine

## 2020-09-13 ENCOUNTER — Ambulatory Visit (INDEPENDENT_AMBULATORY_CARE_PROVIDER_SITE_OTHER): Payer: 59 | Admitting: Osteopathic Medicine

## 2020-09-13 ENCOUNTER — Other Ambulatory Visit (HOSPITAL_COMMUNITY)
Admission: RE | Admit: 2020-09-13 | Discharge: 2020-09-13 | Disposition: A | Discharge status: Home / Self Care | Payer: 59 | Source: Ambulatory Visit | Attending: Osteopathic Medicine | Admitting: Osteopathic Medicine

## 2020-09-13 VITALS — BP 138/87 | HR 72 | Temp 98.2°F | Resp 20 | Ht 63.0 in | Wt 191.0 lb

## 2020-09-13 DIAGNOSIS — Z124 Encounter for screening for malignant neoplasm of cervix: Secondary | ICD-10-CM

## 2020-09-13 DIAGNOSIS — R87612 Low grade squamous intraepithelial lesion on cytologic smear of cervix (LGSIL): Secondary | ICD-10-CM | POA: Diagnosis not present

## 2020-09-13 DIAGNOSIS — Z3046 Encounter for surveillance of implantable subdermal contraceptive: Secondary | ICD-10-CM | POA: Diagnosis not present

## 2020-09-13 NOTE — Patient Instructions (Signed)
Sterile Tape Wound Care  Some cuts and wounds can be closed using sterile tape, also called skin adhesive strips. You can use skin adhesive strips for shallow (superficial) and simple cuts, wounds, skin tears (lacerations), and some surgical incisions. These strips are used in place of stitches, or along with stitches, to hold the edges of your wound together and to promote better healing. Unlike stitches, the adhesive strips do not require needles or anesthetic medicine for placement. The strips usually fall off on their own as your wound is healing. It is important to take proper care of your wound at home while it heals. Supplies needed:  Soap, water, and a clean, dry towel.  If needed: ? Wound cleanser. ? Clean gauze. ? A clean bandage (dressing) or other type of wound dressing material to cover or place on your wound. Follow instructions from your health care provider about what dressing supplies to use. ? Cream or ointment to apply to your wound, if told by your health care provider. How to care for your sterile tape wound Wound care  Try to keep the area around your wound clean and dry. Do not allow the adhesive strips to get wet for the first 12 hours.  Do not use any soaps or ointments on the wound for the first 12 hours.  Ask your health care provider how to clean your wound. This may include: ? Using mild soap and water or a wound cleanser. ? Using a clean gauze to pat the wound dry after cleaning it. Do not rub or scrub your wound.  Do not scratch, rub, or pick at your wound area.  Protect your wound from further injury until it is healed.  Protect your wound from sun and tanning bed exposure while it is healing, and for several weeks after healing. Dressing care  If a dressing was put on your wound, follow instructions from your health care provider about how often to change your dressing. Make sure you: ? Wash your hands with soap and water for at least 20 seconds before  and after you change your dressing. If soap and water are not available, use hand sanitizer. ? Change your dressing as told by your health care provider. ? Leave adhesive strips in place. These skin closures may need to stay in place for 2 weeks or longer. If adhesive strip edges start to loosen and curl up, you may trim the loose edges. Do not remove adhesive strips completely unless your health care provider tells you to do that.  Keep your dressing dry.  Ask your health care provider when you can leave your wound uncovered. Checking for infection Check your wound every day for signs of infection. Check for:  More redness, swelling, or pain.  More fluid or blood.  Warmth.  Pus or a bad smell. Follow these instructions at home: Medicines  Take over-the-counter and prescription medicines only as told by your health care provider.  If you were prescribed an antibiotic medicine, take or apply it as told by your health care provider. Do not stop using the antibiotic even if you start to feel better. General instructions  Do not use any products that contain nicotine or tobacco, such as cigarettes, e-cigarettes, and chewing tobacco. These may delay wound healing. If you need help quitting, ask your health care provider.  Keep all follow-up visits as told by your health care provider. This is important. Contact a health care provider if:  Your adhesive strips become soaked with  blood or fall off before your wound has healed. The tape will need to be replaced.  You have a fever. Get help right away if:  You develop a rash after the strips are applied.  You have a red streak that goes away from your wound.  You have any of these signs of infection: ? More redness, swelling, or pain around your wound. ? More fluid or blood coming from your wound. ? Warmth coming from your wound. ? Pus or a bad smell coming from your wound. ? Chills.  Your wound breaks open. Summary  Some cuts  and wounds can be closed using sterile tape, or skin adhesive strips, without the need for stitches.  The strips usually fall off on their own as your wound is healing.  It is important to take proper care of your wound at home while it heals and to clean it as told by your health care provider. This information is not intended to replace advice given to you by your health care provider. Make sure you discuss any questions you have with your health care provider. Document Revised: 09/24/2019 Document Reviewed: 07/02/2019 Elsevier Patient Education  2021 Elsevier Inc.  

## 2020-09-13 NOTE — Progress Notes (Signed)
1. Encounter for removal and reinsertion of Nexplanon See procedure note below  2. Cervical cancer screening See note below         Nexplanon Removal and Reinsertion Procedure Note PRE-OP DIAGNOSIS: Nexplanon, desire for continuation of same contraception  POST-OP DIAGNOSIS: Same  PROCEDURE: Nexplanon Removal and reinsertion Performing Physician:Iowa Kappes  Risks and benefits reviewed with the patient. Informed consent obtained, patient opts to proceed today.   PROCEDURE:  Anesthesia: 2% Lidocaine w/ epinephrine 5 ml  Procedure: Consent obtained. A time-out was performed prior to initiating procedure to be sure of right patient and right location. The area surrounding the Nexplanon was prepared in the usual sterile manner. The site was anesthetized with lidocaine. A skin incision was made over the distal aspect of the device. The capsule lysed sharply and the device removed using a hemostat. Nexplanon  trocar was inserted subcutaneously and then Nexplanon  capsule delivered subcutaneously Trocar was removed from the insertion site. Nexplanon  capsule was palpated by provider and patient to assure satisfactory placement. Estimated blood loss <1 mL Dressings applied: Steri-Strips and small pressure bandage Followup: The patient tolerated the procedure well without complications.  Standard post-procedure care is explained and return precautions are given.  Followup: The patient tolerated the procedure well without complications. Standard post-procedure care is explained and return precautions are given. Contraception is advised until conception is desired.        PAP/Pelvic exam : Pap obtained, normal pelvic exam   GYN: No lesions/ulcers to external genitalia, normal urethra, normal vaginal mucosa, physiologic discharge, cervix normal without lesions, uterus not enlarged or tender, adnexa no masses and nontender

## 2020-09-16 LAB — CYTOLOGY - PAP
Adequacy: ABSENT
Comment: NEGATIVE
High risk HPV: NEGATIVE

## 2020-09-19 DIAGNOSIS — Z13220 Encounter for screening for lipoid disorders: Secondary | ICD-10-CM | POA: Diagnosis not present

## 2020-09-19 DIAGNOSIS — Z0000 Encounter for general adult medical examination without abnormal findings: Secondary | ICD-10-CM | POA: Diagnosis not present

## 2020-09-19 DIAGNOSIS — R5383 Other fatigue: Secondary | ICD-10-CM | POA: Diagnosis not present

## 2020-09-20 LAB — COMPLETE METABOLIC PANEL WITH GFR
AG Ratio: 1.7 (calc) (ref 1.0–2.5)
ALT: 19 U/L (ref 6–29)
AST: 17 U/L (ref 10–30)
Albumin: 4.4 g/dL (ref 3.6–5.1)
Alkaline phosphatase (APISO): 84 U/L (ref 31–125)
BUN: 10 mg/dL (ref 7–25)
CO2: 23 mmol/L (ref 20–32)
Calcium: 9.7 mg/dL (ref 8.6–10.2)
Chloride: 105 mmol/L (ref 98–110)
Creat: 0.63 mg/dL (ref 0.50–1.10)
GFR, Est African American: 146 mL/min/{1.73_m2} (ref >=60)
GFR, Est Non African American: 126 mL/min/{1.73_m2} (ref >=60)
Globulin: 2.6 g/dL (calc) (ref 1.9–3.7)
Glucose, Bld: 72 mg/dL (ref 65–99)
Potassium: 4.1 mmol/L (ref 3.5–5.3)
Sodium: 140 mmol/L (ref 135–146)
Total Bilirubin: 0.5 mg/dL (ref 0.2–1.2)
Total Protein: 7 g/dL (ref 6.1–8.1)

## 2020-09-20 LAB — CBC
HCT: 41.6 % (ref 35.0–45.0)
Hemoglobin: 14 g/dL (ref 11.7–15.5)
MCH: 30.5 pg (ref 27.0–33.0)
MCHC: 33.7 g/dL (ref 32.0–36.0)
MCV: 90.6 fL (ref 80.0–100.0)
MPV: 13.3 fL — ABNORMAL HIGH (ref 7.5–12.5)
Platelets: 194 10*3/uL (ref 140–400)
RBC: 4.59 10*6/uL (ref 3.80–5.10)
RDW: 12.3 % (ref 11.0–15.0)
WBC: 8 10*3/uL (ref 3.8–10.8)

## 2020-09-20 LAB — LIPID PANEL
Cholesterol: 161 mg/dL (ref <200)
HDL: 44 mg/dL — ABNORMAL LOW (ref >=50)
LDL Cholesterol (Calc): 102 mg/dL (calc) — ABNORMAL HIGH
Non-HDL Cholesterol (Calc): 117 mg/dL (calc) (ref <130)
Total CHOL/HDL Ratio: 3.7 (calc) (ref <5.0)
Triglycerides: 66 mg/dL (ref <150)

## 2020-09-20 LAB — TSH: TSH: 2.54 mIU/L

## 2020-10-10 ENCOUNTER — Ambulatory Visit (INDEPENDENT_AMBULATORY_CARE_PROVIDER_SITE_OTHER): Payer: 59 | Admitting: Family Medicine

## 2020-10-10 ENCOUNTER — Encounter: Payer: Self-pay | Admitting: Family Medicine

## 2020-10-10 ENCOUNTER — Other Ambulatory Visit: Payer: Self-pay

## 2020-10-10 DIAGNOSIS — F321 Major depressive disorder, single episode, moderate: Secondary | ICD-10-CM | POA: Diagnosis not present

## 2020-10-10 DIAGNOSIS — R87612 Low grade squamous intraepithelial lesion on cytologic smear of cervix (LGSIL): Secondary | ICD-10-CM

## 2020-10-10 DIAGNOSIS — Z0000 Encounter for general adult medical examination without abnormal findings: Secondary | ICD-10-CM

## 2020-10-10 NOTE — Patient Instructions (Signed)
Great to see you! Try doubling up on the nexium.  If not improving with this let me know   Preventive Care 25-25 Years Old, Female Preventive care refers to lifestyle choices and visits with your health care provider that can promote health and wellness. This includes:  A yearly physical exam. This is also called an annual wellness visit.  Regular dental and eye exams.  Immunizations.  Screening for certain conditions.  Healthy lifestyle choices, such as: ? Eating a healthy diet. ? Getting regular exercise. ? Not using drugs or products that contain nicotine and tobacco. ? Limiting alcohol use. What can I expect for my preventive care visit? Physical exam Your health care provider may check your:  Height and weight. These may be used to calculate your BMI (body mass index). BMI is a measurement that tells if you are at a healthy weight.  Heart rate and blood pressure.  Body temperature.  Skin for abnormal spots. Counseling Your health care provider may ask you questions about your:  Past medical problems.  Family's medical history.  Alcohol, tobacco, and drug use.  Emotional well-being.  Home life and relationship well-being.  Sexual activity.  Diet, exercise, and sleep habits.  Work and work Statistician.  Access to firearms.  Method of birth control.  Menstrual cycle.  Pregnancy history. What immunizations do I need? Vaccines are usually given at various ages, according to a schedule. Your health care provider will recommend vaccines for you based on your age, medical history, and lifestyle or other factors, such as travel or where you work.   What tests do I need? Blood tests  Lipid and cholesterol levels. These may be checked every 5 years starting at age 25.  Hepatitis C test.  Hepatitis B test. Screening  Diabetes screening. This is done by checking your blood sugar (glucose) after you have not eaten for a while (fasting).  STD (sexually  transmitted disease) testing, if you are at risk.  BRCA-related cancer screening. This may be done if you have a family history of breast, ovarian, tubal, or peritoneal cancers.  Pelvic exam and Pap test. This may be done every 3 years starting at age 25. Starting at age 44, this may be done every 5 years if you have a Pap test in combination with an HPV test. Talk with your health care provider about your test results, treatment options, and if necessary, the need for more tests.   Follow these instructions at home: Eating and drinking  Eat a healthy diet that includes fresh fruits and vegetables, whole grains, lean protein, and low-fat dairy products.  Take vitamin and mineral supplements as recommended by your health care provider.  Do not drink alcohol if: ? Your health care provider tells you not to drink. ? You are pregnant, may be pregnant, or are planning to become pregnant.  If you drink alcohol: ? Limit how much you have to 0-1 drink a day. ? Be aware of how much alcohol is in your drink. In the [place] one drink equals one 12 oz bottle of beer (355 mL), one 5 oz glass of wine (148 mL), or one 1 oz glass of hard liquor (44 mL).   Lifestyle  Take daily care of your teeth and gums. Brush your teeth every morning and night with fluoride toothpaste. Floss one time each day.  Stay active. Exercise for at least 30 minutes 5 or more days each week.  Do not use any products that contain nicotine or  tobacco, such as cigarettes, e-cigarettes, and chewing tobacco. If you need help quitting, ask your health care provider.  Do not use drugs.  If you are sexually active, practice safe sex. Use a condom or other form of protection to prevent STIs (sexually transmitted infections).  If you do not wish to become pregnant, use a form of birth control. If you plan to become pregnant, see your health care provider for a prepregnancy visit.  Find healthy ways to cope with stress, such  as: ? Meditation, yoga, or listening to music. ? Journaling. ? Talking to a trusted person. ? Spending time with friends and family. Safety  Always wear your seat belt while driving or riding in a vehicle.  Do not drive: ? If you have been drinking alcohol. Do not ride with someone who has been drinking. ? When you are tired or distracted. ? While texting.  Wear a helmet and other protective equipment during sports activities.  If you have firearms in your house, make sure you follow all gun safety procedures.  Seek help if you have been physically or sexually abused. What's next?  Go to your health care provider once a year for an annual wellness visit.  Ask your health care provider how often you should have your eyes and teeth checked.  Stay up to date on all vaccines. This information is not intended to replace advice given to you by your health care provider. Make sure you discuss any questions you have with your health care provider. Document Revised: 03/13/2020 Document Reviewed: 03/27/2018 Elsevier Patient Education  2021 [employer].

## 2020-10-10 NOTE — Progress Notes (Signed)
Barbara Hooper - 25 y.o. female MRN [id number]  Date of birth: 07-Sep-1995  Subjective Chief Complaint  Patient presents with  . Annual Exam    HPI Barbara Hooper is a 25 y.o. female here today for annual exam.  I had visit with her about 6 weeks ago and started on lexapro.  She is doing well with this.  She denies side effects.    She had visit with Dr. Lyn Hollingshead and had updated PAP with removal/reinsertion of nexplanon.  Pap with LGSIL, negative HPV.    She does feel like her diet is pretty healthy.  She did start a new job recently, she enjoys this and feels less stressed.   She is a non-smoker but does vape occasionally.  She consumes about 3 glasses of wine per week.   Review of Systems  Constitutional: Negative for chills, fever, malaise/fatigue and weight loss.  HENT: Negative for congestion, ear pain and sore throat.   Eyes: Negative for blurred vision, double vision and pain.  Respiratory: Negative for cough and shortness of breath.   Cardiovascular: Negative for chest pain and palpitations.  Gastrointestinal: Negative for abdominal pain, blood in stool, constipation, heartburn and nausea.  Genitourinary: Negative for dysuria and urgency.  Musculoskeletal: Negative for joint pain and myalgias.  Neurological: Negative for dizziness and headaches.  Endo/Heme/Allergies: Does not bruise/bleed easily.  Psychiatric/Behavioral: Negative for depression. The patient is not nervous/anxious and does not have insomnia.     No Known Allergies  Past Medical History:  Diagnosis Date  . Elevated blood pressure   . GAD (generalized anxiety disorder) 01/09/2016  . Major depression in complete remission (HCC) 05/11/2016    History reviewed. No pertinent surgical history.  Social History   Socioeconomic History  . Marital status: Single    Spouse name: Not on file  . Number of children: Not on file  . Years of education: Not on file  . Highest education level: Not on file   Occupational History  . Not on file  Tobacco Use  . Smoking status: Never Smoker  . Smokeless tobacco: Never Used  Vaping Use  . Vaping Use: Some days  . Substances: Nicotine  Substance and Sexual Activity  . Alcohol use: Yes    Alcohol/week: 3.0 standard drinks    Types: 3 Glasses of wine per week    Comment: WEEKLY  . Drug use: Yes    Types: Marijuana    Comment: OCCASIONALLY  . Sexual activity: Yes    Partners: Male    Birth control/protection: Implant    Comment: Nexplanon  Other Topics Concern  . Not on file  Social History Narrative  . Not on file   Social Determinants of Health   Financial Resource Strain: Not on file  Food Insecurity: Not on file  Transportation Needs: Not on file  Physical Activity: Not on file  Stress: Not on file  Social Connections: Not on file    Family History  Problem Relation Age of Onset  . Hypertension Mother   . Skin cancer Mother   . Hypertension Father   . Cancer Maternal Aunt   . Cancer Maternal Uncle   . Depression Maternal Grandmother   . Breast cancer Paternal Aunt   . Heart attack Paternal Grandfather   . Heart disease Neg Hx     Health Maintenance  Topic Date Due  . COVID-19 Vaccine (3 - Booster for Pfizer series) 05/01/2020  . INFLUENZA VACCINE  10/27/2020 (Originally 02/28/2020)  .  PAP-Cervical Cytology Screening  09/14/2023  . PAP SMEAR-Modifier  09/14/2023  . TETANUS/TDAP  06/05/2027  . HPV VACCINES  Completed  . Hepatitis C Screening  Completed  . HIV Screening  Completed     ----------------------------------------------------------------------------------------------------------------------------------------------------------------------------------------------------------------- Physical Exam BP 126/77 (BP Location: Left Arm, Patient Position: Sitting, Cuff Size: Large)   Pulse 64   Temp 98.1 F (36.7 C)   Ht 5' 4.37" (1.635 m)   Wt 195 lb (88.5 kg)   SpO2 100%   BMI 33.09 kg/m   Physical  Exam Constitutional:      General: She is not in acute distress. HENT:     Head: Normocephalic and atraumatic.     Right Ear: Tympanic membrane normal.     Left Ear: Tympanic membrane normal.     Nose: Nose normal.     Mouth/Throat:     Mouth: Mucous membranes are dry.  Eyes:     General: No scleral icterus.    Conjunctiva/sclera: Conjunctivae normal.  Neck:     Thyroid: No thyromegaly.  Cardiovascular:     Rate and Rhythm: Normal rate and regular rhythm.     Heart sounds: Normal heart sounds.  Pulmonary:     Effort: Pulmonary effort is normal.     Breath sounds: Normal breath sounds.  Abdominal:     General: Bowel sounds are normal. There is no distension.     Palpations: Abdomen is soft.     Tenderness: There is no abdominal tenderness. There is no guarding.  Musculoskeletal:        General: Normal range of motion.     Cervical back: Normal range of motion and neck supple.  Lymphadenopathy:     Cervical: No cervical adenopathy.  Skin:    General: Skin is warm and dry.     Findings: No rash.  Neurological:     Mental Status: She is alert and oriented to person, place, and time.     Cranial Nerves: No cranial nerve deficit.     Coordination: Coordination normal.  Psychiatric:        Mood and Affect: Mood normal.        Behavior: Behavior normal.     ------------------------------------------------------------------------------------------------------------------------------------------------------------------------------------------------------------------- Assessment and Plan  Major depression She is doing well with lexapro.  Continue at current strength.  F/u in 3 months.   Well adult exam Well adult Recent labs reviewed with her.  Immunizations: UTD Screening: UTD. Anticipatory guidance/Risk factor reduction:  Recommendations per AVS  LGSIL on Pap smear of cervix She will have repeat pap in 1 year.    No orders of the defined types were placed in this  encounter.   No follow-ups on file.    This visit occurred during the SARS-CoV-2 public health emergency.  Safety protocols were in place, including screening questions prior to the visit, additional usage of staff PPE, and extensive cleaning of exam room while observing appropriate contact time as indicated for disinfecting solutions.

## 2020-10-10 NOTE — Assessment & Plan Note (Signed)
She will have repeat pap in 1 year.

## 2020-10-10 NOTE — Assessment & Plan Note (Signed)
She is doing well with lexapro.  Continue at current strength.  F/u in 3 months.

## 2020-10-10 NOTE — Assessment & Plan Note (Signed)
Well adult Recent labs reviewed with her.  Immunizations: UTD Screening: UTD. Anticipatory guidance/Risk factor reduction:  Recommendations per AVS

## 2020-12-19 ENCOUNTER — Ambulatory Visit: Payer: 59 | Admitting: Family Medicine

## 2020-12-20 ENCOUNTER — Ambulatory Visit: Payer: 59 | Admitting: Family Medicine

## 2020-12-20 ENCOUNTER — Other Ambulatory Visit: Payer: Self-pay

## 2020-12-20 ENCOUNTER — Other Ambulatory Visit (HOSPITAL_BASED_OUTPATIENT_CLINIC_OR_DEPARTMENT_OTHER): Payer: Self-pay

## 2020-12-20 ENCOUNTER — Encounter: Payer: Self-pay | Admitting: Family Medicine

## 2020-12-20 VITALS — BP 148/82 | HR 63 | Temp 97.8°F | Ht 64.0 in | Wt 205.7 lb

## 2020-12-20 DIAGNOSIS — F411 Generalized anxiety disorder: Secondary | ICD-10-CM | POA: Diagnosis not present

## 2020-12-20 DIAGNOSIS — J069 Acute upper respiratory infection, unspecified: Secondary | ICD-10-CM | POA: Diagnosis not present

## 2020-12-20 DIAGNOSIS — F321 Major depressive disorder, single episode, moderate: Secondary | ICD-10-CM | POA: Diagnosis not present

## 2020-12-20 MED ORDER — ESCITALOPRAM OXALATE 10 MG PO TABS
ORAL_TABLET | ORAL | 1 refills | Status: DC
  Filled 2020-12-20: qty 90, 90d supply, fill #0

## 2020-12-20 NOTE — Patient Instructions (Signed)
Great to see you today! Continue lexapro at current strength.   Push fluids.  Try mucinex as needed.  Let me know if symptoms worsen.    See me again in 6 months.

## 2020-12-20 NOTE — Assessment & Plan Note (Signed)
Recent URI symptoms, resolved at this point.

## 2020-12-20 NOTE — Assessment & Plan Note (Signed)
Depression and anxiety are well managed with lexapro at current strength.  She will continue for now.  We'll plan for follow up in 6 months to reassess continued need

## 2020-12-20 NOTE — Progress Notes (Signed)
Barbara Hooper - 25 y.o. female MRN [id number]  Date of birth: 06/27/1996  Subjective Chief Complaint  Patient presents with  . Neck Pain  . Follow-up    HPI Barbara Hooper is a 25 y.o. female here today for follow up of anxiety.  She also had some neck/jaw pain a few days ago but this was associated with some congestion and low grade fever.  This lasted for a few days and symptoms have pretty much resolved at this point.    She feels like lexapro is working well for her.  She is tolerating this well without significant side effects.  She feels like she wants to continue this for now.    Depression screen [facility] 2/9 12/20/2020 10/10/2020 08/31/2020  Decreased Interest 0 2 3  Down, Depressed, Hopeless 0 1 2  PHQ - 2 Score 0 3 5  Altered sleeping 0 3 1  Tired, decreased energy 0 3 2  Change in appetite 0 0 2  Feeling bad or failure about yourself  0 0 2  Trouble concentrating 1 0 3  Moving slowly or fidgety/restless 0 0 1  Suicidal thoughts 0 0 0  PHQ-9 Score 1 9 16   Difficult doing work/chores Not difficult at all Somewhat difficult Very difficult  Some recent data might be hidden   GAD 7 : Generalized Anxiety Score 12/20/2020 10/10/2020 08/31/2020 05/11/2016  Nervous, Anxious, on Edge 0 0 3 1  Control/stop worrying 0 0 2 0  Worry too much - different things 0 0 3 1  Trouble relaxing 0 0 3 1  Restless 0 0 2 0  Easily annoyed or irritable 0 0 2 0  Afraid - awful might happen 0 0 3 0  Total GAD 7 Score 0 0 18 3  Anxiety Difficulty Not difficult at all Not difficult at all Very difficult Not difficult at all     No Known Allergies  Past Medical History:  Diagnosis Date  . Elevated blood pressure   . GAD (generalized anxiety disorder) 01/09/2016  . Major depression in complete remission (HCC) 05/11/2016    History reviewed. No pertinent surgical history.  Social History   Socioeconomic History  . Marital status: Single    Spouse name: Not on file  . Number of children:  Not on file  . Years of education: Not on file  . Highest education level: Not on file  Occupational History  . Not on file  Tobacco Use  . Smoking status: Never Smoker  . Smokeless tobacco: Never Used  Vaping Use  . Vaping Use: Some days  . Substances: Nicotine  Substance and Sexual Activity  . Alcohol use: Yes    Alcohol/week: 3.0 standard drinks    Types: 3 Glasses of wine per week    Comment: WEEKLY  . Drug use: Yes    Types: Marijuana    Comment: OCCASIONALLY  . Sexual activity: Yes    Partners: Male    Birth control/protection: Implant    Comment: Nexplanon  Other Topics Concern  . Not on file  Social History Narrative  . Not on file   Social Determinants of Health   Financial Resource Strain: Not on file  Food Insecurity: Not on file  Transportation Needs: Not on file  Physical Activity: Not on file  Stress: Not on file  Social Connections: Not on file    Family History  Problem Relation Age of Onset  . Hypertension Mother   . Skin cancer Mother   .  Hypertension Father   . Cancer Maternal Aunt   . Cancer Maternal Uncle   . Depression Maternal Grandmother   . Breast cancer Paternal Aunt   . Heart attack Paternal Grandfather   . Heart disease Neg Hx     Health Maintenance  Topic Date Due  . COVID-19 Vaccine (3 - Booster for Pfizer series) 04/01/2020  . INFLUENZA VACCINE  02/27/2021  . PAP-Cervical Cytology Screening  09/14/2023  . PAP SMEAR-Modifier  09/14/2023  . TETANUS/TDAP  06/05/2027  . HPV VACCINES  Completed  . Hepatitis C Screening  Completed  . HIV Screening  Completed     ----------------------------------------------------------------------------------------------------------------------------------------------------------------------------------------------------------------- Physical Exam BP (!) 148/82 (BP Location: Left Arm, Patient Position: Sitting, Cuff Size: Normal)   Pulse 63   Temp 97.8 F (36.6 C)   Ht 5\' 4"  (1.626 m)    Wt 205 lb 11.2 oz (93.3 kg)   SpO2 100%   BMI 35.31 kg/m   Physical Exam Constitutional:      Appearance: Normal appearance.  HENT:     Head: Normocephalic and atraumatic.     Right Ear: Tympanic membrane normal.     Left Ear: Tympanic membrane normal.     Mouth/Throat:     Mouth: Mucous membranes are moist.  Eyes:     General: No scleral icterus. Cardiovascular:     Rate and Rhythm: Normal rate and regular rhythm.  Pulmonary:     Effort: Pulmonary effort is normal.     Breath sounds: Normal breath sounds.  Neurological:     General: No focal deficit present.     Mental Status: She is alert.  Psychiatric:        Mood and Affect: Mood normal.        Behavior: Behavior normal.     ------------------------------------------------------------------------------------------------------------------------------------------------------------------------------------------------------------------- Assessment and Plan  GAD (generalized anxiety disorder) Depression and anxiety are well managed with lexapro at current strength.  She will continue for now.  We'll plan for follow up in 6 months to reassess continued need  URI (upper respiratory infection) Recent URI symptoms, resolved at this point.    Meds ordered this encounter  Medications  . escitalopram (LEXAPRO) 10 MG tablet    Sig: Take 1 tablet by mouth daily    Dispense:  90 tablet    Refill:  1    Return in about 6 months (around 06/22/2021) for GAD.    This visit occurred during the SARS-CoV-2 public health emergency.  Safety protocols were in place, including screening questions prior to the visit, additional usage of staff PPE, and extensive cleaning of exam room while observing appropriate contact time as indicated for disinfecting solutions.

## 2020-12-25 ENCOUNTER — Other Ambulatory Visit: Payer: Self-pay | Admitting: Family Medicine

## 2020-12-27 ENCOUNTER — Other Ambulatory Visit (HOSPITAL_BASED_OUTPATIENT_CLINIC_OR_DEPARTMENT_OTHER): Payer: Self-pay

## 2021-01-03 ENCOUNTER — Encounter: Payer: Self-pay | Admitting: Family Medicine

## 2021-01-03 ENCOUNTER — Other Ambulatory Visit (HOSPITAL_BASED_OUTPATIENT_CLINIC_OR_DEPARTMENT_OTHER): Payer: Self-pay

## 2021-01-03 MED ORDER — ESCITALOPRAM OXALATE 10 MG PO TABS
ORAL_TABLET | ORAL | 1 refills | Status: DC
  Filled 2021-01-03: qty 90, 90d supply, fill #0
  Filled 2021-04-14: qty 90, 90d supply, fill #1

## 2021-01-13 ENCOUNTER — Other Ambulatory Visit: Payer: Self-pay | Admitting: Family Medicine

## 2021-04-14 ENCOUNTER — Other Ambulatory Visit (HOSPITAL_BASED_OUTPATIENT_CLINIC_OR_DEPARTMENT_OTHER): Payer: Self-pay

## 2021-04-17 ENCOUNTER — Other Ambulatory Visit (HOSPITAL_BASED_OUTPATIENT_CLINIC_OR_DEPARTMENT_OTHER): Payer: Self-pay

## 2021-04-22 ENCOUNTER — Telehealth: Payer: 59 | Admitting: Nurse Practitioner

## 2021-04-22 DIAGNOSIS — N3001 Acute cystitis with hematuria: Secondary | ICD-10-CM | POA: Diagnosis not present

## 2021-04-22 MED ORDER — NITROFURANTOIN MONOHYD MACRO 100 MG PO CAPS: 100.0000 mg | ORAL_CAPSULE | Freq: Two times a day (BID) | ORAL | 0 refills | Status: AC

## 2021-04-22 NOTE — Progress Notes (Signed)
E-Visit for Urinary Problems ° °We are sorry that you are not feeling well.  Here is how we plan to help! ° °Based on what you shared with me it looks like you most likely have a simple urinary tract infection. ° °A UTI (Urinary Tract Infection) is a bacterial infection of the bladder. ° °Most cases of urinary tract infections are simple to treat but a key part of your care is to encourage you to drink plenty of fluids and watch your symptoms carefully. ° °I have prescribed MacroBid 100 mg twice a day for 5 days.  Your symptoms should gradually improve. Call us if the burning in your urine worsens, you develop worsening fever, back pain or pelvic pain or if your symptoms do not resolve after completing the antibiotic. ° °Urinary tract infections can be prevented by drinking plenty of water to keep your body hydrated.  Also be sure when you wipe, wipe from front to back and don't hold it in!  If possible, empty your bladder every 4 hours. ° °HOME CARE °Drink plenty of fluids °Compete the full course of the antibiotics even if the symptoms resolve °Remember, when you need to go…go. Holding in your urine can increase the likelihood of getting a UTI! °GET HELP RIGHT AWAY IF: °You cannot urinate °You get a high fever °Worsening back pain occurs °You see blood in your urine °You feel sick to your stomach or throw up °You feel like you are going to pass out ° °MAKE SURE YOU  °Understand these instructions. °Will watch your condition. °Will get help right away if you are not doing well or get worse. ° ° °Thank you for choosing an e-visit. ° °Your e-visit answers were reviewed by a board certified advanced clinical practitioner to complete your personal care plan. Depending upon the condition, your plan could have included both over the counter or prescription medications. ° °Please review your pharmacy choice. Make sure the pharmacy is open so you can pick up prescription now. If there is a problem, you may contact your  provider through MyChart messaging and have the prescription routed to another pharmacy.  Your safety is important to us. If you have drug allergies check your prescription carefully.  ° °For the next 24 hours you can use MyChart to ask questions about today's visit, request a non-urgent call back, or ask for a work or school excuse. °You will get an email in the next two days asking about your experience. I hope that your e-visit has been valuable and will speed your recovery.  ° °I spent approximately 7 minutes reviewing the patient's history, current symptoms and coordinating their plan of care today.   ° °Meds ordered this encounter  °Medications  ° nitrofurantoin, macrocrystal-monohydrate, (MACROBID) 100 MG capsule  °  Sig: Take 1 capsule (100 mg total) by mouth 2 (two) times daily for 5 days.  °  Dispense:  10 capsule  °  Refill:  0  °  °

## 2021-06-26 ENCOUNTER — Ambulatory Visit: Payer: 59 | Admitting: Family Medicine

## 2021-06-26 ENCOUNTER — Other Ambulatory Visit: Payer: Self-pay

## 2021-06-26 ENCOUNTER — Other Ambulatory Visit (HOSPITAL_BASED_OUTPATIENT_CLINIC_OR_DEPARTMENT_OTHER): Payer: Self-pay

## 2021-06-26 ENCOUNTER — Encounter: Payer: Self-pay | Admitting: Family Medicine

## 2021-06-26 VITALS — BP 131/83 | HR 64 | Ht 64.0 in | Wt 243.0 lb

## 2021-06-26 DIAGNOSIS — F411 Generalized anxiety disorder: Secondary | ICD-10-CM | POA: Diagnosis not present

## 2021-06-26 DIAGNOSIS — Z23 Encounter for immunization: Secondary | ICD-10-CM

## 2021-06-26 DIAGNOSIS — F321 Major depressive disorder, single episode, moderate: Secondary | ICD-10-CM | POA: Diagnosis not present

## 2021-06-26 MED ORDER — ESCITALOPRAM OXALATE 10 MG PO TABS
ORAL_TABLET | ORAL | 1 refills | Status: DC
  Filled 2021-06-26 – 2021-09-06 (×3): qty 90, 90d supply, fill #0
  Filled 2021-12-07: qty 90, 90d supply, fill #1

## 2021-06-26 NOTE — Assessment & Plan Note (Signed)
Depression anxiety remains well controlled with current dose of Lexapro.  Continue at 10 mg daily.  We will plan to follow-up in 4 to 6 months.  We can consider trying her off of this at that time if she is doing well.

## 2021-06-26 NOTE — Progress Notes (Signed)
Barbara Hooper - 25 y.o. female MRN [id number]  Date of birth: [birth year]  Subjective Chief Complaint  Patient presents with   Anxiety    HPI Barbara Hooper is a 25 year old female here today for follow-up of depression and anxiety.  She reports overall she is doing well.  She feels that Lexapro continues to work well for her.  She has not noted any significant side effects related to this.  Her weight is up some since last visit however does not feel that medication is significantly contributing to this.  She is really happy in her current job position.  She has no other concerns at this time.  Depression screen [facility] 2/9 06/26/2021 12/20/2020 10/10/2020  Decreased Interest 0 0 2  Down, Depressed, Hopeless 1 0 1  PHQ - 2 Score 1 0 3  Altered sleeping 1 0 3  Tired, decreased energy 1 0 3  Change in appetite 0 0 0  Feeling bad or failure about yourself  0 0 0  Trouble concentrating 3 1 0  Moving slowly or fidgety/restless 0 0 0  Suicidal thoughts 0 0 0  PHQ-9 Score 6 1 9   Difficult doing work/chores Somewhat difficult Not difficult at all Somewhat difficult  Some recent data might be hidden   GAD 7 : Generalized Anxiety Score 06/26/2021 12/20/2020 10/10/2020 08/31/2020  Nervous, Anxious, on Edge 0 0 0 3  Control/stop worrying 0 0 0 2  Worry too much - different things 1 0 0 3  Trouble relaxing 0 0 0 3  Restless 1 0 0 2  Easily annoyed or irritable 0 0 0 2  Afraid - awful might happen 0 0 0 3  Total GAD 7 Score 2 0 0 18  Anxiety Difficulty Somewhat difficult Not difficult at all Not difficult at all Very difficult    ROS:  A comprehensive ROS was completed and negative except as noted per HPI  No Known Allergies  Past Medical History:  Diagnosis Date   Elevated blood pressure    GAD (generalized anxiety disorder) 01/09/2016   Major depression in complete remission (HCC) 05/11/2016    History reviewed. No pertinent surgical history.  Social History   Socioeconomic History    Marital status: Single    Spouse name: Not on file   Number of children: Not on file   Years of education: Not on file   Highest education level: Not on file  Occupational History   Not on file  Tobacco Use   Smoking status: Never   Smokeless tobacco: Never  Vaping Use   Vaping Use: Some days   Substances: Nicotine  Substance and Sexual Activity   Alcohol use: Yes    Alcohol/week: 3.0 standard drinks    Types: 3 Glasses of wine per week    Comment: WEEKLY   Drug use: Yes    Types: Marijuana    Comment: OCCASIONALLY   Sexual activity: Yes    Partners: Male    Birth control/protection: Implant    Comment: Nexplanon  Other Topics Concern   Not on file  Social History Narrative   Not on file   Social Determinants of Health   Financial Resource Strain: Not on file  Food Insecurity: Not on file  Transportation Needs: Not on file  Physical Activity: Not on file  Stress: Not on file  Social Connections: Not on file    Family History  Problem Relation Age of Onset   Hypertension Mother    Skin cancer Mother  Hypertension Father    Cancer Maternal Aunt    Cancer Maternal Uncle    Depression Maternal Grandmother    Breast cancer Paternal Aunt    Heart attack Paternal Grandfather    Heart disease Neg Hx     Health Maintenance  Topic Date Due   COVID-19 Vaccine (3 - Booster for Pfizer series) 12/26/2019   PAP-Cervical Cytology Screening  09/14/2023   PAP SMEAR-Modifier  09/14/2023   TETANUS/TDAP  06/05/2027   INFLUENZA VACCINE  Completed   HPV VACCINES  Completed   Hepatitis C Screening  Completed   HIV Screening  Completed   Pneumococcal Vaccine 78-91 Years old  Aged Out     ----------------------------------------------------------------------------------------------------------------------------------------------------------------------------------------------------------------- Physical Exam BP 131/83 (BP Location: Left Arm, Patient Position: Sitting,  Cuff Size: Large)   Pulse 64   Ht 5\' 4"  (1.626 m)   Wt 243 lb (110.2 kg)   SpO2 99%   BMI 41.71 kg/m   Physical Exam Constitutional:      Appearance: Normal appearance.  Eyes:     General: No scleral icterus. Musculoskeletal:     Cervical back: Neck supple.  Neurological:     General: No focal deficit present.     Mental Status: She is alert.  Psychiatric:        Mood and Affect: Mood normal.        Behavior: Behavior normal.    ------------------------------------------------------------------------------------------------------------------------------------------------------------------------------------------------------------------- Assessment and Plan  GAD (generalized anxiety disorder) Depression anxiety remains well controlled with current dose of Lexapro.  Continue at 10 mg daily.  We will plan to follow-up in 4 to 6 months.  We can consider trying her off of this at that time if she is doing well.   Meds ordered this encounter  Medications   escitalopram (LEXAPRO) 10 MG tablet    Sig: Take 1 tablet by mouth daily    Dispense:  90 tablet    Refill:  1    Return in about 6 months (around 12/24/2021) for GAD.    This visit occurred during the SARS-CoV-2 public health emergency.  Safety protocols were in place, including screening questions prior to the visit, additional usage of staff PPE, and extensive cleaning of exam room while observing appropriate contact time as indicated for disinfecting solutions.

## 2021-06-28 ENCOUNTER — Other Ambulatory Visit (HOSPITAL_BASED_OUTPATIENT_CLINIC_OR_DEPARTMENT_OTHER): Payer: Self-pay

## 2021-07-05 ENCOUNTER — Telehealth: Payer: 59 | Admitting: Emergency Medicine

## 2021-07-05 DIAGNOSIS — R6889 Other general symptoms and signs: Secondary | ICD-10-CM | POA: Diagnosis not present

## 2021-07-05 DIAGNOSIS — J111 Influenza due to unidentified influenza virus with other respiratory manifestations: Secondary | ICD-10-CM

## 2021-07-05 MED ORDER — BENZONATATE 100 MG PO CAPS: 100.0000 mg | ORAL_CAPSULE | Freq: Two times a day (BID) | ORAL | 0 refills | Status: DC | PRN

## 2021-07-05 MED ORDER — OSELTAMIVIR PHOSPHATE 75 MG PO CAPS: 75.0000 mg | ORAL_CAPSULE | Freq: Two times a day (BID) | ORAL | 0 refills | Status: AC

## 2021-07-05 NOTE — Progress Notes (Signed)
E visit for Flu like symptoms   We are sorry that you are not feeling well.  Here is how we plan to help! Based on what you have shared with me it looks like you may have a respiratory virus that may be influenza.  Influenza or "the flu" is   an infection caused by a respiratory virus. The flu virus is highly contagious and persons who did not receive their yearly flu vaccination may "catch" the flu from close contact.  We have anti-viral medications to treat the viruses that cause this infection. They are not a "cure" and only shorten the course of the infection. These prescriptions are most effective when they are given within the first 2 days of "flu" symptoms. Antiviral medication are indicated if you have a high risk of complications from the flu. You should  also consider an antiviral medication if you are in close contact with someone who is at risk. These medications can help patients avoid complications from the flu  but have side effects that you should know. Possible side effects from Tamiflu or oseltamivir include nausea, vomiting, diarrhea, dizziness, headaches, eye redness, sleep problems or other respiratory symptoms. You should not take Tamiflu if you have an allergy to oseltamivir or any to the ingredients in Tamiflu.  Based upon your symptoms and potential risk factors I have prescribed Oseltamivir (Tamiflu).  It has been sent to your designated pharmacy.  You will take one 75 mg capsule orally twice a day for the next 5 days.  ANYONE WHO HAS FLU SYMPTOMS SHOULD: Stay home. The flu is highly contagious and going out or to work exposes others! Be sure to drink plenty of fluids. Water is fine as well as fruit juices, sodas and electrolyte beverages. You may want to stay away from caffeine or alcohol. If you are nauseated, try taking small sips of liquids. How do you know if you are getting enough fluid? Your urine should be a pale yellow or almost colorless. Get rest. Taking a steamy  shower or using a humidifier may help nasal congestion and ease sore throat pain. Using a saline nasal spray works much the same way. Cough drops, hard candies and sore throat lozenges may ease your cough. Line up a caregiver. Have someone check on you regularly.   GET HELP RIGHT AWAY IF: You cannot keep down liquids or your medications. You become short of breath Your fell like you are going to pass out or loose consciousness. Your symptoms persist after you have completed your treatment plan MAKE SURE YOU  Understand these instructions. Will watch your condition. Will get help right away if you are not doing well or get worse.  Your e-visit answers were reviewed by a board certified advanced clinical practitioner to complete your personal care plan.  Depending on the condition, your plan could have included both over the counter or prescription medications.  If there is a problem please reply  once you have received a response from your provider.  Your safety is important to us.  If you have drug allergies check your prescription carefully.    You can use MyChart to ask questions about today's visit, request a non-urgent call back, or ask for a work or school excuse for 24 hours related to this e-Visit. If it has been greater than 24 hours you will need to follow up with your provider, or enter a new e-Visit to address those concerns.  You will get an e-mail in the next   two days asking about your experience.  I hope that your e-visit has been valuable and will speed your recovery. Thank you for using e-visits.   Approximately 5 minutes was used in reviewing the patient's chart, questionnaire, prescribing medications, and documentation.  

## 2021-08-12 ENCOUNTER — Telehealth: Payer: 59 | Admitting: Nurse Practitioner

## 2021-08-12 DIAGNOSIS — R42 Dizziness and giddiness: Secondary | ICD-10-CM | POA: Diagnosis not present

## 2021-08-12 MED ORDER — MECLIZINE HCL 25 MG PO TABS: 25.0000 mg | ORAL_TABLET | Freq: Three times a day (TID) | ORAL | 0 refills | Status: DC | PRN

## 2021-08-12 MED ORDER — FLUTICASONE PROPIONATE 50 MCG/ACT NA SUSP: 2.0000 | Freq: Every day | NASAL | 0 refills | Status: AC

## 2021-08-12 NOTE — Progress Notes (Signed)
I have spent 5 minutes in review of e-visit questionnaire, review and updating patient chart, medical decision making and response to patient.  ° °Barbara Hooper W Tyona Nilsen, NP ° °  °

## 2021-08-12 NOTE — Progress Notes (Signed)
E Visit for Motion Sickness  We are sorry that you are not feeling well. Here is how we plan to help!  Based on what you have shared with me it looks like you have symptoms of motion sickness.  I have prescribed a medication that will help prevent or alleviate your symptoms:  Meclizine 25mg by mouth three times per day as needed for nausea/motion sickness   Prevention:  You might feel better if you keep your eyes focused on outside while you are in motion. For example, if you are in a car, sit in the front and look in the direction you are moving; if you are on a boat, stay on the deck and look to the horizon. This helps make what you see match the movement you are feeling, and so you are less likely to feel sick.  You should also avoid reading, watching a movie, texting or reading messages, or looking at things close to you inside the vehicle you are riding in.  Use the seat head rest. Lean your head against the back of the seat or head rest when traveling in vehicles with seats to minimize head movements.  On a ship: When making your reservations, choose a cabin in the middle of the ship and near the waterline. When on board, go up on deck and focus on the horizon.  In an airplane: Request a window seat and look out the window. A seat over the front edge of the wing is the most preferable spot (the degree of motion is the lowest here). Direct the air vent to blow cool air on your face.  On a train: Always face forward and sit near a window.  In a vehicle: Sit in the front seat; if you are the passenger, look at the scenery in the distance. For some people, driving the vehicle (rather than being a passenger) is an instant remedy.  Avoid others who have become nauseous with motion sickness. Seeing and smelling others who have motion sickness may cause you to become sick.  GET HELP RIGHT AWAY IF:  Your symptoms do not improve or worsen within 2 days after treatment.  You cannot keep  down fluids after trying the medication.  Other associated symptoms such as severe headache, visual field changes, fever, or intractable nausea and vomiting.  MAKE SURE YOU:  Understand these instructions. Will watch your condition. Will get help right away if you are not doing well or get worse.  Thank you for choosing an e-visit.  Your e-visit answers were reviewed by a board certified advanced clinical practitioner to complete your personal care plan. Depending upon the condition, your plan could have included both over the counter or prescription medications.  Please review your pharmacy choice. Be sure that the pharmacy you have chosen is open so that you can pick up your prescription now.  If there is a problem you may message your provider in MyChart to have the prescription routed to another pharmacy.  Your safety is important to us. If you have drug allergies check your prescription carefully.   For the next 24 hours, you can use MyChart to ask questions about today's visit, request a non-urgent call back, or ask for a work or school excuse from your e-visit provider.  You will get an e-mail in the next two days asking about your experience. I hope that your e-visit has been valuable and will speed your recovery.   References or for more information: https://wwwnc.cdc.gov/travel/yellowbook/2020/travel-by-air-land-sea/motion-sickness https://my.[facility].org/health/articles/12782-motion-sickness https://www.uptodate.com  

## 2021-09-06 ENCOUNTER — Other Ambulatory Visit (HOSPITAL_BASED_OUTPATIENT_CLINIC_OR_DEPARTMENT_OTHER): Payer: Self-pay

## 2021-12-09 ENCOUNTER — Other Ambulatory Visit (HOSPITAL_COMMUNITY): Payer: Self-pay

## 2021-12-11 ENCOUNTER — Other Ambulatory Visit (HOSPITAL_COMMUNITY): Payer: Self-pay

## 2021-12-22 ENCOUNTER — Ambulatory Visit: Payer: Managed Care, Other (non HMO) | Admitting: Family Medicine

## 2021-12-22 ENCOUNTER — Encounter: Payer: Self-pay | Admitting: Family Medicine

## 2021-12-22 ENCOUNTER — Other Ambulatory Visit (HOSPITAL_BASED_OUTPATIENT_CLINIC_OR_DEPARTMENT_OTHER): Payer: Self-pay

## 2021-12-22 DIAGNOSIS — F411 Generalized anxiety disorder: Secondary | ICD-10-CM

## 2021-12-22 MED ORDER — ESCITALOPRAM OXALATE 10 MG PO TABS: ORAL_TABLET | ORAL | 1 refills | Status: DC

## 2021-12-22 MED ORDER — ESCITALOPRAM OXALATE 10 MG PO TABS
ORAL_TABLET | ORAL | 1 refills | Status: DC
  Filled 2021-12-22: qty 90, fill #0

## 2021-12-22 NOTE — Assessment & Plan Note (Signed)
Anxiety remains well controlled with lexapro.  Will continue at current strength.  Will follow up in 6 months or sooner if needed.

## 2021-12-22 NOTE — Progress Notes (Signed)
Barbara Hooper - 26 y.o. female MRN [id number]  Date of birth: 01/18/1996  Subjective No chief complaint on file.   HPI Barbara Hooper is a 26 y.o. female here today for follow up visit.  Reports that overall she is doing well.   Continues on lexapro for management of anxiety.  Tolerating well without significant side effects.  She has had some fatigue, but overall this isn't bothersome for her.    ROS:  A comprehensive ROS was completed and negative except as noted per HPI   No Known Allergies  Past Medical History:  Diagnosis Date   Elevated blood pressure    GAD (generalized anxiety disorder) 01/09/2016   Major depression in complete remission (HCC) 05/11/2016    No past surgical history on file.  Social History   Socioeconomic History   Marital status: Single    Spouse name: Not on file   Number of children: Not on file   Years of education: Not on file   Highest education level: Not on file  Occupational History   Not on file  Tobacco Use   Smoking status: Never   Smokeless tobacco: Never  Vaping Use   Vaping Use: Some days   Substances: Nicotine  Substance and Sexual Activity   Alcohol use: Yes    Alcohol/week: 3.0 standard drinks    Types: 3 Glasses of wine per week    Comment: WEEKLY   Drug use: Yes    Types: Marijuana    Comment: OCCASIONALLY   Sexual activity: Yes    Partners: Male    Birth control/protection: Implant    Comment: Nexplanon  Other Topics Concern   Not on file  Social History Narrative   Not on file   Social Determinants of Health   Financial Resource Strain: Not on file  Food Insecurity: Not on file  Transportation Needs: Not on file  Physical Activity: Not on file  Stress: Not on file  Social Connections: Not on file    Family History  Problem Relation Age of Onset   Hypertension Mother    Skin cancer Mother    Hypertension Father    Cancer Maternal Aunt    Cancer Maternal Uncle    Depression Maternal Grandmother     Breast cancer Paternal Aunt    Heart attack Paternal Grandfather    Heart disease Neg Hx     Health Maintenance  Topic Date Due   COVID-19 Vaccine (3 - Booster for Pfizer series) 12/26/2019   INFLUENZA VACCINE  02/27/2022   PAP-Cervical Cytology Screening  09/14/2023   PAP SMEAR-Modifier  09/14/2023   TETANUS/TDAP  06/05/2027   HPV VACCINES  Completed   Hepatitis C Screening  Completed   HIV Screening  Completed     ----------------------------------------------------------------------------------------------------------------------------------------------------------------------------------------------------------------- Physical Exam BP 128/81 (BP Location: Left Arm, Patient Position: Sitting, Cuff Size: Large)   Pulse 80   Ht 5\' 4"  (1.626 m)   Wt 253 lb (114.8 kg)   SpO2 96%   BMI 43.43 kg/m   Physical Exam Constitutional:      Appearance: Normal appearance.  Eyes:     General: No scleral icterus. Musculoskeletal:     Cervical back: Neck supple.  Neurological:     Mental Status: She is alert.  Psychiatric:        Mood and Affect: Mood normal.        Behavior: Behavior normal.    ------------------------------------------------------------------------------------------------------------------------------------------------------------------------------------------------------------------- Assessment and Plan  GAD (generalized anxiety disorder) Anxiety remains well  controlled with lexapro.  Will continue at current strength.  Will follow up in 6 months or sooner if needed.    No orders of the defined types were placed in this encounter.   No follow-ups on file.    This visit occurred during the SARS-CoV-2 public health emergency.  Safety protocols were in place, including screening questions prior to the visit, additional usage of staff PPE, and extensive cleaning of exam room while observing appropriate contact time as indicated for disinfecting solutions.

## 2022-03-05 ENCOUNTER — Telehealth: Payer: Managed Care, Other (non HMO) | Admitting: Physician Assistant

## 2022-03-05 DIAGNOSIS — R509 Fever, unspecified: Secondary | ICD-10-CM | POA: Diagnosis not present

## 2022-03-05 MED ORDER — AMOXICILLIN-POT CLAVULANATE 875-125 MG PO TABS: 1.0000 | ORAL_TABLET | Freq: Two times a day (BID) | ORAL | 0 refills | Status: DC

## 2022-03-05 NOTE — Progress Notes (Signed)
Virtual Visit Consent   Andree Elk, you are scheduled for a virtual visit with a [facility] provider today. Just as with appointments in the office, your consent must be obtained to participate. Your consent will be active for this visit and any virtual visit you may have with one of our providers in the next 365 days. If you have a MyChart account, a copy of this consent can be sent to you electronically.  As this is a virtual visit, video technology does not allow for your provider to perform a traditional examination. This may limit your provider's ability to fully assess your condition. If your provider identifies any concerns that need to be evaluated in person or the need to arrange testing (such as labs, EKG, etc.), we will make arrangements to do so. Although advances in technology are sophisticated, we cannot ensure that it will always work on either your end or our end. If the connection with a video visit is poor, the visit may have to be switched to a telephone visit. With either a video or telephone visit, we are not always able to ensure that we have a secure connection.  By engaging in this virtual visit, you consent to the provision of healthcare and authorize for your insurance to be billed (if applicable) for the services provided during this visit. Depending on your insurance coverage, you may receive a charge related to this service.  I need to obtain your verbal consent now. Are you willing to proceed with your visit today? Barbara Hooper has provided verbal consent on 03/05/2022 for a virtual visit (video or telephone). Margaretann Loveless, PA-C  Date: 03/05/2022 10:05 AM  Virtual Visit via Video Note   I, Margaretann Loveless, connected with  Barbara Hooper  ([id number], 03/01/1996) on 03/05/22 at 10:00 AM EDT by a video-enabled telemedicine application and verified that I am speaking with the correct person using two identifiers.  Location: Patient: Virtual Visit  Location Patient: Home Provider: Virtual Visit Location Provider: Home Office   I discussed the limitations of evaluation and management by telemedicine and the availability of in person appointments. The patient expressed understanding and agreed to proceed.    History of Present Illness: Barbara Hooper is a 26 y.o. who identifies as a female who was assigned female at birth, and is being seen today for fever and body aches.  HPI: Fever  This is a new problem. The current episode started in the past 7 days (3 days, Friday). The problem occurs 2 to 4 times per day. The problem has been unchanged. The maximum temperature noted was 100 to 100.9 F (100.8). The temperature was taken using an oral thermometer. Associated symptoms include headaches and muscle aches. Pertinent negatives include no congestion, diarrhea, nausea, sore throat, urinary pain or vomiting. She has tried NSAIDs (400mg  Ibuprofen every 6-8 hours and Nyquil at night) for the symptoms. The treatment provided no relief.  No known tick bite or insect exposure.   At home Covid 19 testing was negative yesterday.   Problems:  Patient Active Problem List   Diagnosis Date Noted   URI (upper respiratory infection) 12/20/2020   Well adult exam 10/10/2020   LGSIL on Pap smear of cervix 10/10/2020   DDD (degenerative disc disease), lumbar 12/05/2017   Nexplanon in place 09/23/2017   Acute lumbar myofascial strain 05/21/2017   Major depression 05/11/2016   GAD (generalized anxiety disorder) 01/09/2016    Allergies: No Known Allergies Medications:  Current Outpatient Medications:    amoxicillin-clavulanate (AUGMENTIN) 875-125 MG tablet, Take 1 tablet by mouth 2 (two) times daily., Disp: 14 tablet, Rfl: 0   escitalopram (LEXAPRO) 10 MG tablet, Take 1 tablet by mouth daily, Disp: 90 tablet, Rfl: 1   etonogestrel (NEXPLANON) 68 MG IMPL implant, 1 each once by Subdermal route., Disp: , Rfl:    fluticasone (FLONASE) 50 MCG/ACT nasal  spray, Place 2 sprays into both nostrils daily., Disp: 16 g, Rfl: 0   loratadine (CLARITIN) 10 MG tablet, Take 10 mg by mouth daily., Disp: , Rfl:    omeprazole (PRILOSEC) 20 MG capsule, Take 20 mg by mouth daily., Disp: , Rfl:   Observations/Objective: Patient is well-developed, well-nourished in no acute distress.  Resting comfortably at home.  Head is normocephalic, atraumatic.  No labored breathing.  Speech is clear and coherent with logical content.  Patient is alert and oriented at baseline.    Assessment and Plan: 1. Fever, unspecified fever cause - amoxicillin-clavulanate (AUGMENTIN) 875-125 MG tablet; Take 1 tablet by mouth 2 (two) times daily.  Dispense: 14 tablet; Refill: 0  - Unknown cause at this time - Will treat with broad-spectrum Augmentin  - Push fluids, electrolyte beverages - May continue tylenol and Ibuprofen alternating every 4-6 hours as needed for fevers and body aches - Seek further evaluation if symptoms worsen, change, or just fail to improve over next 7-10 days  Follow Up Instructions: I discussed the assessment and treatment plan with the patient. The patient was provided an opportunity to ask questions and all were answered. The patient agreed with the plan and demonstrated an understanding of the instructions.  A copy of instructions were sent to the patient via MyChart unless otherwise noted below.    The patient was advised to call back or seek an in-person evaluation if the symptoms worsen or if the condition fails to improve as anticipated.  Time:  I spent 10 minutes with the patient via telehealth technology discussing the above problems/concerns.    Margaretann Loveless, PA-C

## 2022-03-05 NOTE — Patient Instructions (Signed)
Andree Elk, thank you for joining Margaretann Loveless, PA-C for today's virtual visit.  While this provider is not your primary care provider (PCP), if your PCP is located in our provider database this encounter information will be shared with them immediately following your visit.  Consent: (Patient) Barbara Hooper provided verbal consent for this virtual visit at the beginning of the encounter.  Current Medications:  Current Outpatient Medications:    amoxicillin-clavulanate (AUGMENTIN) 875-125 MG tablet, Take 1 tablet by mouth 2 (two) times daily., Disp: 14 tablet, Rfl: 0   escitalopram (LEXAPRO) 10 MG tablet, Take 1 tablet by mouth daily, Disp: 90 tablet, Rfl: 1   etonogestrel (NEXPLANON) 68 MG IMPL implant, 1 each once by Subdermal route., Disp: , Rfl:    fluticasone (FLONASE) 50 MCG/ACT nasal spray, Place 2 sprays into both nostrils daily., Disp: 16 g, Rfl: 0   loratadine (CLARITIN) 10 MG tablet, Take 10 mg by mouth daily., Disp: , Rfl:    omeprazole (PRILOSEC) 20 MG capsule, Take 20 mg by mouth daily., Disp: , Rfl:    Medications ordered in this encounter:  Meds ordered this encounter  Medications   amoxicillin-clavulanate (AUGMENTIN) 875-125 MG tablet    Sig: Take 1 tablet by mouth 2 (two) times daily.    Dispense:  14 tablet    Refill:  0    Order Specific Question:   Supervising Provider    Answer:   Hyacinth Meeker, BRIAN [[id number]]     *If you need refills on other medications prior to your next appointment, please contact your pharmacy*  Follow-Up: Call back or seek an in-person evaluation if the symptoms worsen or if the condition fails to improve as anticipated.  Other Instructions Fever, Adult     A fever is an increase in the body's temperature. It is usually defined as a temperature of 100.17F (38C) or higher. Brief mild or moderate fevers generally have no long-term effects, and they often do not need treatment. Moderate or high fevers may make you feel  uncomfortable and can sometimes be a sign of a serious illness or disease. The sweating that may occur with repeated or prolonged fever may also cause a loss of fluid in the body (dehydration). Fever is confirmed by taking a temperature with a thermometer. A measured temperature can vary with: Age. Time of day. Where in the body you take the temperature. Readings may vary if you place the thermometer: In the mouth (oral). In the rectum (rectal). In the ear (tympanic). Under the arm (axillary). On the forehead (temporal). Follow these instructions at home: Medicines Take over-the counter and prescription medicines only as told by your health care provider. Follow the dosing instructions carefully. If you were prescribed an antibiotic medicine, take it as told by your health care provider. Do not stop taking the antibiotic even if you start to feel better. General instructions Watch your condition for any changes. Let your health care provider know about them. Rest as needed. Drink enough fluid to keep your urine pale yellow. This helps to prevent dehydration. Sponge yourself or bathe with room-temperature water to help reduce your body temperature as needed. Do not use ice water. Do not use too many blankets or wear clothes that are too heavy. If your fever may be caused by an infection that spreads from person to person (is contagious), such as a cold or the flu, you should stay home from work and public gatherings for at least 24 hours after your fever  is gone. Your fever should be gone without the need to use medicines. Contact a health care provider if: You vomit. You cannot eat or drink without vomiting. You have diarrhea. You have pain when you urinate. Your symptoms do not improve with treatment. You develop new symptoms. You develop excessive weakness. Get help right away if: You have shortness of breath or have trouble breathing. You are dizzy or you faint. You are disoriented  or confused. You develop signs of dehydration, such as: Dark urine, very little urine, or no urine. Cracked lips. Dry mouth. Sunken eyes. Sleepiness. Weakness. You develop severe pain in your abdomen. You have persistent vomiting or diarrhea. You develop a skin rash. Your symptoms suddenly get worse. Summary A fever is an increase in the body's temperature. It is usually defined as a temperature of 100.55F (38C) or higher. Moderate or high fevers can sometimes be a sign of a serious illness or disease. The sweating that may occur with repeated or prolonged fever may also cause dehydration. Pay attention to any changes in your symptoms and contact your health care provider if your symptoms do not improve with treatment. Take over-the counter and prescription medicines only as told by your health care provider. Follow the dosing instructions carefully. If your fever is from an infection that may be contagious, such as cold or flu, you should stay home from work and public gatherings for at least 24 hours after your fever is gone. Your fever should be gone without the need to use medicines. Get help right away if you develop signs of dehydration, such as dark urine, cracked lips, dry mouth, sunken eyes, sleepiness, or weakness. This information is not intended to replace advice given to you by your health care provider. Make sure you discuss any questions you have with your health care provider. Document Revised: 11/13/2021 Document Reviewed: 12/06/2020 Elsevier Patient Education  2023 Elsevier Inc.    If you have been instructed to have an in-person evaluation today at a local Urgent Care facility, please use the link below. It will take you to a list of all of our available [facility], including address, phone number and hours of operation. Please do not delay care.  [facility]  If you or a family member do not have a primary care provider, use the link below  to schedule a visit and establish care. When you choose a [facility] primary care physician or advanced practice provider, you gain a long-term partner in health. Find a Primary Care Provider  Learn more about [facility] in-office and virtual care options: [facility] Get Care Now

## 2022-03-08 ENCOUNTER — Ambulatory Visit
Admission: RE | Admit: 2022-03-08 | Discharge: 2022-03-08 | Disposition: A | Discharge status: Home / Self Care | Payer: Managed Care, Other (non HMO) | Source: Ambulatory Visit | Attending: Family Medicine | Admitting: Family Medicine

## 2022-03-08 VITALS — BP 122/83 | HR 79 | Temp 99.1°F | Resp 20 | Ht 64.0 in | Wt 250.0 lb

## 2022-03-08 DIAGNOSIS — R509 Fever, unspecified: Secondary | ICD-10-CM

## 2022-03-08 LAB — POCT URINALYSIS DIP (MANUAL ENTRY)
Bilirubin, UA: NEGATIVE
Blood, UA: NEGATIVE
Glucose, UA: NEGATIVE mg/dL
Ketones, POC UA: NEGATIVE mg/dL
Leukocytes, UA: NEGATIVE
Nitrite, UA: NEGATIVE
Protein Ur, POC: NEGATIVE mg/dL
Spec Grav, UA: <=1.005 — AB (ref 1.010–1.025)
Urobilinogen, UA: 0.2 E.U./dL
pH, UA: 6.5 (ref 5.0–8.0)

## 2022-03-08 LAB — CBC WITH DIFFERENTIAL/PLATELET
Absolute Monocytes: 663 cells/uL (ref 200–950)
Basophils Absolute: 60 cells/uL (ref 0–200)
Basophils Relative: 0.9 %
Eosinophils Absolute: 20 cells/uL (ref 15–500)
Eosinophils Relative: 0.3 %
HCT: 42.1 % (ref 35.0–45.0)
Hemoglobin: 14.4 g/dL (ref 11.7–15.5)
Lymphs Abs: 2881 cells/uL (ref 850–3900)
MCH: 30.4 pg (ref 27.0–33.0)
MCHC: 34.2 g/dL (ref 32.0–36.0)
MCV: 89 fL (ref 80.0–100.0)
MPV: 13.6 fL — ABNORMAL HIGH (ref 7.5–12.5)
Monocytes Relative: 9.9 %
Neutro Abs: 3075 cells/uL (ref 1500–7800)
Neutrophils Relative %: 45.9 %
Platelets: 137 10*3/uL — ABNORMAL LOW (ref 140–400)
RBC: 4.73 10*6/uL (ref 3.80–5.10)
RDW: 12.2 % (ref 11.0–15.0)
Total Lymphocyte: 43 %
WBC: 6.7 10*3/uL (ref 3.8–10.8)

## 2022-03-08 LAB — POCT INFLUENZA A/B
Influenza A, POC: NEGATIVE
Influenza B, POC: NEGATIVE

## 2022-03-08 LAB — POC SARS CORONAVIRUS 2 AG -  ED: SARS Coronavirus 2 Ag: NEGATIVE

## 2022-03-08 NOTE — ED Triage Notes (Signed)
Pt states that she has had a fever x6 days.  Pt states that she has since been prescribed antibiotics and is still having a fever.   Pt states that she has taken a at home covid test which was negative.   Pt states that she is vaccinated for covid.  Pt states that she has a headache as well.

## 2022-03-08 NOTE — ED Provider Notes (Addendum)
[facility]    CSN: [id number] Arrival date & time: 03/08/22  4098      History   Chief Complaint Chief Complaint  Patient presents with   Fever    I've been running a fever since Friday of last week and started antibiotics on Tuesday but I'm still running a fever - Entered by patient    HPI [name] is a 26 y.o. female.   Six days ago patient developed fatigue, myalgias, headache, and fever to 100+.  She denies onset of sore throat, cough, nasal congestion, abdominal/pelvic pain and GU symptoms.  She did have mild nausea initially that ceased after she stopped taking ibuprofen. Three days ago she participated in a video visit wherein she was prescribed Augmentin for one week.  She had a negative home COVID test three days ago.  She denies tick or insect exposure. She reports no improvement since beginning Augmentin. She states that she has a history of UTI, although denies symptoms at present.  The history is provided by the patient.    Past Medical History:  Diagnosis Date   Elevated blood pressure    GAD (generalized anxiety disorder) 01/09/2016   Major depression in complete remission (HCC) 05/11/2016    Patient Active Problem List   Diagnosis Date Noted   URI (upper respiratory infection) 12/20/2020   Well adult exam 10/10/2020   LGSIL on Pap smear of cervix 10/10/2020   DDD (degenerative disc disease), lumbar 12/05/2017   Nexplanon in place 09/23/2017   Acute lumbar myofascial strain 05/21/2017   Major depression 05/11/2016   GAD (generalized anxiety disorder) 01/09/2016    History reviewed. No pertinent surgical history.  OB History   No obstetric history on file.      Home Medications    Prior to Admission medications   Medication Sig Start Date End Date Taking? Authorizing Provider  amoxicillin-clavulanate (AUGMENTIN) 875-125 MG tablet Take 1 tablet by mouth 2 (two) times daily. 03/05/22  Yes Margaretann Loveless, PA-C   escitalopram (LEXAPRO) 10 MG tablet Take 1 tablet by mouth daily 12/22/21  Yes Everrett Coombe, DO  etonogestrel (NEXPLANON) 68 MG IMPL implant 1 each once by Subdermal route.   Yes [provider]  fluticasone (FLONASE) 50 MCG/ACT nasal spray Place 2 sprays into both nostrils daily. 08/12/21  Yes Claiborne Rigg, NP  loratadine (CLARITIN) 10 MG tablet Take 10 mg by mouth daily.   Yes [provider]  omeprazole (PRILOSEC) 20 MG capsule Take 20 mg by mouth daily.   Yes [provider]    Family History Family History  Problem Relation Age of Onset   Hypertension Mother    Skin cancer Mother    Hypertension Father    Cancer Maternal Aunt    Cancer Maternal Uncle    Depression Maternal Grandmother    Breast cancer Paternal Aunt    Heart attack Paternal Grandfather    Heart disease Neg Hx     Social History Social History   Tobacco Use   Smoking status: Never   Smokeless tobacco: Never  Vaping Use   Vaping Use: Some days   Substances: Nicotine  Substance Use Topics   Alcohol use: Yes    Alcohol/week: 3.0 standard drinks of alcohol    Types: 3 Glasses of wine per week    Comment: WEEKLY   Drug use: Yes    Types: Marijuana    Comment: OCCASIONALLY     Allergies   Patient has no  known allergies.   Review of Systems Review of Systems No sore throat No cough No pleuritic pain No wheezing No nasal congestion No post-nasal drainage No sinus pain/pressure No itchy/red eyes No earache No hemoptysis No SOB + fever/chills + nausea, resolved No vomiting No abdominal pain No diarrhea No urinary symptoms No skin rash + fatigue + myalgias + headache   Physical Exam Triage Vital Signs ED Triage Vitals  Enc Vitals Group     BP 03/08/22 0903 122/83     Pulse Rate 03/08/22 0903 79     Resp 03/08/22 0903 20     Temp 03/08/22 0903 99.1 F (37.3 C)     Temp Source 03/08/22 0903 Oral     SpO2 03/08/22 0903 95 %     Weight 03/08/22 0902  250 lb (113.4 kg)     Height 03/08/22 0902 5\' 4"  (1.626 m)     Head Circumference --      Peak Flow --      Pain Score 03/08/22 0901 0     Pain Loc --      Pain Edu? --      Excl. in GC? --    No data found.  Updated Vital Signs BP 122/83 (BP Location: Left Arm)   Pulse 79   Temp 99.1 F (37.3 C) (Oral)   Resp 20   Ht 5\' 4"  (1.626 m)   Wt 113.4 kg   SpO2 95%   BMI 42.91 kg/m   Visual Acuity Right Eye Distance:   Left Eye Distance:   Bilateral Distance:    Right Eye Near:   Left Eye Near:    Bilateral Near:     Physical Exam Nursing notes and Vital Signs reviewed. Appearance:  Patient appears stated age, and in no acute distress Eyes:  Pupils are equal, round, and reactive to light and accomodation.  Extraocular movement is intact.  Conjunctivae are not inflamed  Ears:  Canals normal.  Tympanic membranes normal.  Nose:   Normal turbinates.  No sinus tenderness.  Pharynx:  Normal Neck:  Supple. No adenopathy. Lungs:  Clear to auscultation.  Breath sounds are equal.  Moving air well. Heart:  Regular rate and rhythm without murmurs, rubs, or gallops.  Abdomen:   Minimal mid-line tenderness over bladder without masses or hepatosplenomegaly.  Bowel sounds are present.  Mild right flank tenderness present. Extremities:  No edema.  Skin:  No rash present.   UC Treatments / Results  Labs (all labs ordered are listed, but only abnormal results are displayed) POCT urinalysis, COVID, and Flu A/B negative  EKG   Radiology No results found.  Procedures Procedures (including critical care time)  Medications Ordered in UC Medications - No data to display  Initial Impression / Assessment and Plan / UC Course  I have reviewed the triage vital signs and the nursing notes.  Pertinent labs & imaging results that were available during my care of the patient were reviewed by me and considered in my medical decision making (see chart for details).    Etiology of  fever/illness not clear.  Will check CBC and urine culture. Continue Augmentin Followup with PCP in 4 days.  Final Clinical Impressions(s) / UC Diagnoses   Final diagnoses:  Fever of unknown origin     Discharge Instructions      Continue Augmentin.  Continue increased fluid intake.  Rest.  May continue Tylenol as needed.  Monitor your symptoms.  If symptoms become significantly worse during  the night or over the weekend, proceed to the local emergency room.      ED Prescriptions   None       Lattie Haw, MD 03/08/22 0932    Lattie Haw, MD 03/08/22 [phone]

## 2022-03-08 NOTE — Discharge Instructions (Signed)
Continue Augmentin.  Continue increased fluid intake.  Rest.  May continue Tylenol as needed.  Monitor your symptoms.  If symptoms become significantly worse during the night or over the weekend, proceed to the local emergency room.

## 2022-03-08 NOTE — ED Triage Notes (Signed)
1000mg  of Tylenol at 6:00 am.

## 2022-03-09 ENCOUNTER — Encounter: Payer: Self-pay | Admitting: Family Medicine

## 2022-03-09 LAB — URINE CULTURE: Culture: NO GROWTH

## 2022-03-15 ENCOUNTER — Encounter: Payer: Self-pay | Admitting: Family Medicine

## 2022-03-15 ENCOUNTER — Ambulatory Visit (INDEPENDENT_AMBULATORY_CARE_PROVIDER_SITE_OTHER): Payer: Managed Care, Other (non HMO) | Admitting: Family Medicine

## 2022-03-15 VITALS — BP 130/87 | HR 72 | Ht 64.0 in | Wt 258.0 lb

## 2022-03-15 DIAGNOSIS — J028 Acute pharyngitis due to other specified organisms: Secondary | ICD-10-CM | POA: Diagnosis not present

## 2022-03-15 DIAGNOSIS — R509 Fever, unspecified: Secondary | ICD-10-CM | POA: Diagnosis not present

## 2022-03-15 MED ORDER — DEXAMETHASONE SODIUM PHOSPHATE 10 MG/ML IJ SOLN
10.0000 mg | Freq: Once | INTRAMUSCULAR | Status: AC
  Administered 2022-03-15: 10 mg via INTRAMUSCULAR

## 2022-03-15 NOTE — Assessment & Plan Note (Signed)
Fevers have resolved however she is having throat pain now. She does have some exudates.  Low suspicion for strep as she just completed course of augmentin.  Checking monospot and EBV serology.  Also checking CMV serology.  Given dexamethasone 10mg  IM today to help with acute throat pain.  She may continue supportive measures at home as well.   Contact clinic if having new/worsening symptoms.

## 2022-03-15 NOTE — Progress Notes (Signed)
I am comfortable like this but I can give enterically upside down Barbara Hooper - 26 y.o. female MRN [id number]  Date of birth: Dec 04, 1995  Subjective No chief complaint on file.      HPI Barbara Hooper is a 26 y.o. female here today for urgent care follow up.   Seen at urgent care with fatigue, fever, myalgias and headache . Symptoms initially started >1 week ago.  Prescribed augmentin initially for one week but continued to have fever.  Negative COVID at home.  Urine culture and cbc completed at urgent care. Platelets were a little low but otherwise labs were normal.    Today she reports that fever has resolved but she is having ear pain and sore throat. She has white patches on her tonsils.  It is painful for her to swallow.  She denies GI symptoms, headache.   ROS:  A comprehensive ROS was completed and negative except as noted per HPI   No Known Allergies  Past Medical History:  Diagnosis Date   Elevated blood pressure    GAD (generalized anxiety disorder) 01/09/2016   Major depression in complete remission (HCC) 05/11/2016    No past surgical history on file.  Social History   Socioeconomic History   Marital status: Single    Spouse name: Not on file   Number of children: Not on file   Years of education: Not on file   Highest education level: Not on file  Occupational History   Not on file  Tobacco Use   Smoking status: Never   Smokeless tobacco: Never  Vaping Use   Vaping Use: Some days   Substances: Nicotine  Substance and Sexual Activity   Alcohol use: Yes    Alcohol/week: 3.0 standard drinks of alcohol    Types: 3 Glasses of wine per week    Comment: WEEKLY   Drug use: Yes    Types: Marijuana    Comment: OCCASIONALLY   Sexual activity: Yes    Partners: Male    Birth control/protection: Implant    Comment: Nexplanon  Other Topics Concern   Not on file  Social History Narrative   Not on file   Social Determinants of Health   Financial  Resource Strain: Not on file  Food Insecurity: Not on file  Transportation Needs: Not on file  Physical Activity: Not on file  Stress: Not on file  Social Connections: Not on file    Family History  Problem Relation Age of Onset   Hypertension Mother    Skin cancer Mother    Hypertension Father    Cancer Maternal Aunt    Cancer Maternal Uncle    Depression Maternal Grandmother    Breast cancer Paternal Aunt    Heart attack Paternal Grandfather    Heart disease Neg Hx     Health Maintenance  Topic Date Due   INFLUENZA VACCINE  02/27/2022   COVID-19 Vaccine (3 - Pfizer series) 03/30/2022 (Originally 12/26/2019)   PAP-Cervical Cytology Screening  09/14/2023   PAP SMEAR-Modifier  09/14/2023   TETANUS/TDAP  06/05/2027   HPV VACCINES  Completed   Hepatitis C Screening  Completed   HIV Screening  Completed     ----------------------------------------------------------------------------------------------------------------------------------------------------------------------------------------------------------------- Physical Exam BP 130/87   Pulse 72   Ht 5\' 4"  (1.626 m)   Wt 258 lb (117 kg)   SpO2 96%   BMI 44.29 kg/m   Physical Exam Constitutional:      Appearance: Normal appearance.  Eyes:  General: No scleral icterus. Cardiovascular:     Rate and Rhythm: Normal rate and regular rhythm.  Pulmonary:     Effort: Pulmonary effort is normal.     Breath sounds: Normal breath sounds.  Neurological:     Mental Status: She is alert.  Psychiatric:        Mood and Affect: Mood normal.        Behavior: Behavior normal.    ------------------------------------------------------------------------------------------------------------------------------------------------------------------------------------------------------------------- Assessment and Plan  Fever Fevers have resolved however she is having throat pain now. She does have some exudates.  Low suspicion for  strep as she just completed course of augmentin.  Checking monospot and EBV serology.  Also checking CMV serology.  Given dexamethasone 10mg  IM today to help with acute throat pain.  She may continue supportive measures at home as well.   Contact clinic if having new/worsening symptoms.    Meds ordered this encounter  Medications   dexamethasone (DECADRON) injection 10 mg    No follow-ups on file.    This visit occurred during the SARS-CoV-2 public health emergency.  Safety protocols were in place, including screening questions prior to the visit, additional usage of staff PPE, and extensive cleaning of exam room while observing appropriate contact time as indicated for disinfecting solutions.

## 2022-03-15 NOTE — Patient Instructions (Signed)

## 2022-03-20 LAB — MONONUCLEOSIS SCREEN: Heterophile, Mono Screen: POSITIVE — AB

## 2022-03-20 LAB — EPSTEIN-BARR VIRUS VCA ANTIBODY PANEL
EBV NA IgG: <18 U/mL
EBV VCA IgG: 57.6 U/mL — ABNORMAL HIGH
EBV VCA IgM: <36 U/mL

## 2022-03-20 LAB — CMV ABS, IGG+IGM (CYTOMEGALOVIRUS)
CMV IgM: 96.4 AU/mL — ABNORMAL HIGH
Cytomegalovirus Ab-IgG: >10 U/mL — ABNORMAL HIGH

## 2022-06-25 ENCOUNTER — Ambulatory Visit (INDEPENDENT_AMBULATORY_CARE_PROVIDER_SITE_OTHER): Payer: Managed Care, Other (non HMO) | Admitting: Family Medicine

## 2022-06-25 ENCOUNTER — Encounter: Payer: Self-pay | Admitting: Family Medicine

## 2022-06-25 VITALS — BP 136/84 | HR 81 | Ht 64.0 in | Wt 268.0 lb

## 2022-06-25 DIAGNOSIS — R635 Abnormal weight gain: Secondary | ICD-10-CM

## 2022-06-25 DIAGNOSIS — Z6841 Body Mass Index (BMI) 40.0 and over, adult: Secondary | ICD-10-CM

## 2022-06-25 DIAGNOSIS — Z23 Encounter for immunization: Secondary | ICD-10-CM | POA: Diagnosis not present

## 2022-06-25 DIAGNOSIS — F321 Major depressive disorder, single episode, moderate: Secondary | ICD-10-CM | POA: Diagnosis not present

## 2022-06-25 DIAGNOSIS — F411 Generalized anxiety disorder: Secondary | ICD-10-CM

## 2022-06-25 MED ORDER — ESCITALOPRAM OXALATE 10 MG PO TABS
ORAL_TABLET | ORAL | 1 refills | Status: DC
Start: 2022-06-25 — End: 2022-10-27

## 2022-06-25 NOTE — Assessment & Plan Note (Signed)
Anxiety and depression are well controlled at this time with Lexapro.  We will plan to continue this at current strength.

## 2022-06-25 NOTE — Patient Instructions (Signed)

## 2022-06-25 NOTE — Progress Notes (Signed)
Barbara Hooper - 26 y.o. female MRN [id number]  Date of birth: 1996-02-03  Subjective Chief Complaint  Patient presents with   GAD    HPI Barbara Hooper is a 26 y.o. female here today for follow-up visit.  She feels that she is continuing to do well with Lexapro at current strength.  She does not have any significant side effects.  She would like to continue this.  She does have concerns about weight gain.  She has made some changes to her diet however has not seen a whole lot of change in her weight.  She is not very active at this time.  She would be interested in speaking with a dietitian.  Additionally, she may be interested in trying medications to help with weight management.     06/25/2022    9:34 AM 12/22/2021    8:16 AM 06/26/2021    3:34 PM  Depression screen PHQ 2/9  Decreased Interest 1 0 0  Down, Depressed, Hopeless 0 0 1  PHQ - 2 Score 1 0 1  Altered sleeping 2 0 1  Tired, decreased energy 2 1 1   Change in appetite 2 0 0  Feeling bad or failure about yourself  1 0 0  Trouble concentrating 3 0 3  Moving slowly or fidgety/restless 0 0 0  Suicidal thoughts 0 0 0  PHQ-9 Score 11 1 6   Difficult doing work/chores Somewhat difficult Not difficult at all Somewhat difficult      06/25/2022    9:34 AM 06/26/2021    3:34 PM 12/20/2020    3:28 PM 10/10/2020    4:32 PM  GAD 7 : Generalized Anxiety Score  Nervous, Anxious, on Edge 2 0 0 0  Control/stop worrying 0 0 0 0  Worry too much - different things 2 1 0 0  Trouble relaxing 0 0 0 0  Restless 0 1 0 0  Easily annoyed or irritable 3 0 0 0  Afraid - awful might happen 1 0 0 0  Total GAD 7 Score 8 2 0 0  Anxiety Difficulty Somewhat difficult Somewhat difficult Not difficult at all Not difficult at all      ROS:  A comprehensive ROS was completed and negative except as noted per HPI  No Known Allergies  Past Medical History:  Diagnosis Date   Elevated blood pressure    GAD (generalized anxiety disorder)  01/09/2016   Major depression in complete remission (HCC) 05/11/2016    History reviewed. No pertinent surgical history.  Social History   Socioeconomic History   Marital status: Single    Spouse name: Not on file   Number of children: Not on file   Years of education: Not on file   Highest education level: Not on file  Occupational History   Not on file  Tobacco Use   Smoking status: Never   Smokeless tobacco: Never  Vaping Use   Vaping Use: Some days   Substances: Nicotine  Substance and Sexual Activity   Alcohol use: Yes    Alcohol/week: 3.0 standard drinks of alcohol    Types: 3 Glasses of wine per week    Comment: WEEKLY   Drug use: Yes    Types: Marijuana    Comment: OCCASIONALLY   Sexual activity: Yes    Partners: Male    Birth control/protection: Implant    Comment: Nexplanon  Other Topics Concern   Not on file  Social History Narrative   Not on file  Social Determinants of Health   Financial Resource Strain: Not on file  Food Insecurity: Not on file  Transportation Needs: Not on file  Physical Activity: Not on file  Stress: Not on file  Social Connections: Not on file    Family History  Problem Relation Age of Onset   Hypertension Mother    Skin cancer Mother    Hypertension Father    Cancer Maternal Aunt    Cancer Maternal Uncle    Depression Maternal Grandmother    Breast cancer Paternal Aunt    Heart attack Paternal Grandfather    Heart disease Neg Hx     Health Maintenance  Topic Date Due   COVID-19 Vaccine (3 - 2023-24 season) 08/30/2022 (Originally 03/30/2022)   PAP-Cervical Cytology Screening  09/14/2023   PAP SMEAR-Modifier  09/14/2023   INFLUENZA VACCINE  Completed   HPV VACCINES  Completed   Hepatitis C Screening  Completed   HIV Screening  Completed      ----------------------------------------------------------------------------------------------------------------------------------------------------------------------------------------------------------------- Physical Exam BP 136/84 (BP Location: Left Arm, Patient Position: Sitting, Cuff Size: Large)   Pulse 81   Ht 5\' 4"  (1.626 m)   Wt 268 lb (121.6 kg)   SpO2 97%   BMI 46.00 kg/m   Physical Exam Constitutional:      Appearance: Normal appearance.  Neurological:     Mental Status: She is alert.  Psychiatric:        Mood and Affect: Mood normal.        Behavior: Behavior normal.     ------------------------------------------------------------------------------------------------------------------------------------------------------------------------------------------------------------------- Assessment and Plan  GAD (generalized anxiety disorder) Anxiety and depression are well controlled at this time with Lexapro.  We will plan to continue this at current strength.  Abnormal weight gain Encouraged to continue work on dietary change.  Recommend she incorporate some regular activity and exercise.  Follow-up in 3 months if not seeing some improvement with weight we can further discuss medications to help with weight management.  I did give her information regarding healthy weight and wellness as well.   Meds ordered this encounter  Medications   escitalopram (LEXAPRO) 10 MG tablet    Sig: Take 1 tablet by mouth daily    Dispense:  90 tablet    Refill:  1    Return in about 3 months (around 09/25/2022) for F/u weight/anxiety.    This visit occurred during the SARS-CoV-2 public health emergency.  Safety protocols were in place, including screening questions prior to the visit, additional usage of staff PPE, and extensive cleaning of exam room while observing appropriate contact time as indicated for disinfecting solutions.

## 2022-06-25 NOTE — Assessment & Plan Note (Signed)
Encouraged to continue work on dietary change.  Recommend she incorporate some regular activity and exercise.  Follow-up in 3 months if not seeing some improvement with weight we can further discuss medications to help with weight management.  I did give her information regarding healthy weight and wellness as well.

## 2022-07-31 ENCOUNTER — Ambulatory Visit: Payer: Managed Care, Other (non HMO) | Admitting: Dietician

## 2022-08-01 ENCOUNTER — Telehealth: Payer: Managed Care, Other (non HMO) | Admitting: Physician Assistant

## 2022-08-01 DIAGNOSIS — J019 Acute sinusitis, unspecified: Secondary | ICD-10-CM

## 2022-08-01 DIAGNOSIS — B9689 Other specified bacterial agents as the cause of diseases classified elsewhere: Secondary | ICD-10-CM

## 2022-08-01 MED ORDER — AMOXICILLIN-POT CLAVULANATE 875-125 MG PO TABS: 1.0000 | ORAL_TABLET | Freq: Two times a day (BID) | ORAL | 0 refills | Status: DC

## 2022-08-01 NOTE — Progress Notes (Signed)

## 2022-08-09 ENCOUNTER — Encounter: Payer: Self-pay | Admitting: Dietician

## 2022-08-09 ENCOUNTER — Encounter: Payer: Managed Care, Other (non HMO) | Attending: Family Medicine | Admitting: Dietician

## 2022-08-09 VITALS — Ht 64.0 in | Wt 264.8 lb

## 2022-08-09 DIAGNOSIS — R635 Abnormal weight gain: Secondary | ICD-10-CM | POA: Insufficient documentation

## 2022-08-09 NOTE — Progress Notes (Signed)
Medical Nutrition Therapy  Appointment Start time:  1400  Appointment End time:  9935  Primary concerns today: Pt states over the last few years she has been gaining weight and she is concerned about this and her blood pressure.   Referral diagnosis: abnormal weight gain Preferred learning style: no preference indicated Learning readiness: ready   NUTRITION ASSESSMENT   Anthropometrics  Ht: 64in Wt 08/09/22: 264.8lbs  Clinical Medical Hx: anxiety, depression Medications: reviewed Labs: reviewed Notable Signs/Symptoms: pt reports she has dry patches on scalp Food Allergies: none  Lifestyle & Dietary Hx Pt wants to reduce blood pressure and is concerned about weight gain.  Pt had back injury that she feels like would improve if she reduced her weight.   Pt works from 8am-5pm, gets home at [employer] and cooks dinner.  Pt states she has been eating takeout more recently but enjoys cooking at home  Pt used to go to the gym for stress relief but reports she only went once last year and would like to get back into this habit.   Pt reports she has a history of yo-yo dieting.   Pt reports she has skipped breakfast for year and states she usually finds herself snacking after dinner.   Estimated daily fluid intake: 16 oz Supplements: none Sleep: 6-8 hours Stress / self-care: high stress, takes bubble baths for self-care Current average weekly physical activity: ADLs  24-Hr Dietary Recall First Meal: coffee with sweetened oat creamer Snack: none  Second Meal: 11:30am: tomato sandwich OR soup  Snack: none Third Meal: home/out: 7pm: fish tacos on corn tortillas with cole slaw and guacamole with sweet potato fries OR vietnamese food OR hawaiian food (rice, protein and vegetable) Snack: ice cream OR chocolate covered raisins Beverages: 20oz iced coffee with sweetened oat creamer, herbal tea sometimes with honey, wine OR whiskey 2x/daily   NUTRITION DIAGNOSIS  NB-1.1 Food and  nutrition-related knowledge deficit As related to lack of prior nutrition education by a nutrition professional.  As evidenced by pt report and diet hx.   NUTRITION INTERVENTION  Nutrition education (E-1) on the following topics:  Building balanced meals and snacks MyPlate Physical activity benefits and goals Impact of adequate sleep on health Strategies for reducing sodium intake Yo-yo dieting/fad diet risks and impacts on health Importance of consistent meal times on energy levels and hunger cues  Handouts Provided Include  Snack Sheet Dish Up a Healthy Meal  Learning Style & Readiness for Change Teaching method utilized: Visual & Auditory  Demonstrated degree of understanding via: Teach Back  Barriers to learning/adherence to lifestyle change: none  Goals Established by Pt Aim for 150 minutes of physical activity weekly. Make physical activity a part of your week. Try to include at least 30 minutes of physical activity 5 days each week or at least 150 minutes per week. Regular physical activity promotes overall health-including helping to reduce risk for heart disease and diabetes, promoting mental health, and helping [place] sleep better.     Goal: Exercise 3 days per week for 30 minutes. Walking, hiking, trails, gym, elliptical.  Goal: drink 32 oz water bottle daily.  Aim to eat within 1-2 hours of waking up and every 3-5 hours following.  Goal: eat 3 meals per day, try to make 1/2 of your plate vegetables and fruit   MONITORING & EVALUATION Dietary intake, weekly physical activity, and follow up in 3 months.  Next Steps  Patient is to call for questions.

## 2022-08-09 NOTE — Patient Instructions (Addendum)
Aim for 150 minutes of physical activity weekly. Make physical activity a part of your week. Try to include at least 30 minutes of physical activity 5 days each week or at least 150 minutes per week. Regular physical activity promotes overall health-including helping to reduce risk for heart disease and diabetes, promoting mental health, and helping [place] sleep better.     Goal: Exercise 3 days per week for 30 minutes. Walking, hiking, trails, gym, elliptical.  Goal: drink 32 oz water bottle daily.  Aim to eat within 1-2 hours of waking up and every 3-5 hours following.  Goal: eat 3 meals per day, try to make 1/2 of your plate vegetables and fruit  [email or web address]

## 2022-09-07 ENCOUNTER — Telehealth: Payer: Managed Care, Other (non HMO) | Admitting: Physician Assistant

## 2022-09-07 DIAGNOSIS — B9689 Other specified bacterial agents as the cause of diseases classified elsewhere: Secondary | ICD-10-CM

## 2022-09-07 DIAGNOSIS — J069 Acute upper respiratory infection, unspecified: Secondary | ICD-10-CM | POA: Diagnosis not present

## 2022-09-07 MED ORDER — AMOXICILLIN-POT CLAVULANATE 875-125 MG PO TABS: 1.0000 | ORAL_TABLET | Freq: Two times a day (BID) | ORAL | 0 refills | Status: DC

## 2022-09-07 MED ORDER — PROMETHAZINE-DM 6.25-15 MG/5ML PO SYRP: 5.0000 mL | ORAL_SOLUTION | Freq: Four times a day (QID) | ORAL | 0 refills | Status: DC | PRN

## 2022-09-07 MED ORDER — BENZONATATE 100 MG PO CAPS: 100.0000 mg | ORAL_CAPSULE | Freq: Three times a day (TID) | ORAL | 0 refills | Status: DC | PRN

## 2022-09-07 NOTE — Progress Notes (Signed)
Virtual Visit Consent   Barbara Hooper, you are scheduled for a virtual visit with a [facility] provider today. Just as with appointments in the office, your consent must be obtained to participate. Your consent will be active for this visit and any virtual visit you may have with one of our providers in the next 365 days. If you have a MyChart account, a copy of this consent can be sent to you electronically.  As this is a virtual visit, video technology does not allow for your provider to perform a traditional examination. This may limit your provider's ability to fully assess your condition. If your provider identifies any concerns that need to be evaluated in person or the need to arrange testing (such as labs, EKG, etc.), we will make arrangements to do so. Although advances in technology are sophisticated, we cannot ensure that it will always work on either your end or our end. If the connection with a video visit is poor, the visit may have to be switched to a telephone visit. With either a video or telephone visit, we are not always able to ensure that we have a secure connection.  By engaging in this virtual visit, you consent to the provision of healthcare and authorize for your insurance to be billed (if applicable) for the services provided during this visit. Depending on your insurance coverage, you may receive a charge related to this service.  I need to obtain your verbal consent now. Are you willing to proceed with your visit today? Barbara Hooper has provided verbal consent on 09/07/2022 for a virtual visit (video or telephone). Barbara Daring, PA-C  Date: 09/07/2022 11:59 AM  Virtual Visit via Video Note   I, Barbara Hooper, connected with  Barbara Hooper  ([id number], [birth year]) on 09/07/22 at 11:45 AM EST by a video-enabled telemedicine application and verified that I am speaking with the correct person using two identifiers.  Location: Patient: Virtual Visit  Location Patient: Home Provider: Virtual Visit Location Provider: Home Office   I discussed the limitations of evaluation and management by telemedicine and the availability of in person appointments. The patient expressed understanding and agreed to proceed.    History of Present Illness: Barbara Hooper is a 27 y.o. who identifies as a female who was assigned female at birth, and is being seen today for URI symptoms.  HPI: URI  This is a new problem. The current episode started in the past 7 days. The problem has been gradually worsening. There has been no fever. Associated symptoms include chest pain (tightness), congestion, coughing (productive), diarrhea, ear pain (both sides (L>R)), headaches, a sore throat (tickle) and wheezing. Pertinent negatives include no nausea, rhinorrhea, sinus pain or vomiting. Associated symptoms comments: Post nasal drainage. She has tried acetaminophen, decongestant and antihistamine for the symptoms. The treatment provided no relief.   Negative at home Covid 19 test   Problems:  Patient Active Problem List   Diagnosis Date Noted   Abnormal weight gain 06/25/2022   Fever 03/15/2022   Well adult exam 10/10/2020   LGSIL on Pap smear of cervix 10/10/2020   DDD (degenerative disc disease), lumbar 12/05/2017   Nexplanon in place 09/23/2017   Acute lumbar myofascial strain 05/21/2017   Major depression 05/11/2016   GAD (generalized anxiety disorder) 01/09/2016    Allergies: No Known Allergies Medications:  Current Outpatient Medications:    amoxicillin-clavulanate (AUGMENTIN) 875-125 MG tablet, Take 1 tablet by mouth 2 (two) times daily., Disp:  20 tablet, Rfl: 0   benzonatate (TESSALON) 100 MG capsule, Take 1 capsule (100 mg total) by mouth 3 (three) times daily as needed., Disp: 30 capsule, Rfl: 0   promethazine-dextromethorphan (PROMETHAZINE-DM) 6.25-15 MG/5ML syrup, Take 5 mLs by mouth 4 (four) times daily as needed for cough., Disp: 118 mL, Rfl: 0    escitalopram (LEXAPRO) 10 MG tablet, Take 1 tablet by mouth daily, Disp: 90 tablet, Rfl: 1   etonogestrel (NEXPLANON) 68 MG IMPL implant, 1 each once by Subdermal route., Disp: , Rfl:    fluticasone (FLONASE) 50 MCG/ACT nasal spray, Place 2 sprays into both nostrils daily., Disp: 16 g, Rfl: 0   loratadine (CLARITIN) 10 MG tablet, Take 10 mg by mouth daily., Disp: , Rfl:    omeprazole (PRILOSEC) 20 MG capsule, Take 20 mg by mouth daily., Disp: , Rfl:   Observations/Objective: Patient is well-developed, well-nourished in no acute distress.  Resting comfortably at home.  Head is normocephalic, atraumatic.  No labored breathing.  Speech is clear and coherent with logical content.  Patient is alert and oriented at baseline.    Assessment and Plan: 1. Bacterial upper respiratory infection - amoxicillin-clavulanate (AUGMENTIN) 875-125 MG tablet; Take 1 tablet by mouth 2 (two) times daily.  Dispense: 20 tablet; Refill: 0 - promethazine-dextromethorphan (PROMETHAZINE-DM) 6.25-15 MG/5ML syrup; Take 5 mLs by mouth 4 (four) times daily as needed for cough.  Dispense: 118 mL; Refill: 0 - benzonatate (TESSALON) 100 MG capsule; Take 1 capsule (100 mg total) by mouth 3 (three) times daily as needed.  Dispense: 30 capsule; Refill: 0  - Worsening symptoms that have not responded to OTC medications.  - Will give Augmentin - Promethazine DM and Tessalon for cough - May use Mucinex or Delsym for daytime cough, can combine with Tessalon as needed.  - Steam and humidifier can help - Stay well hydrated and get plenty of rest.  - Seek in person evaluation if no symptom improvement or if symptoms worsen   Follow Up Instructions: I discussed the assessment and treatment plan with the patient. The patient was provided an opportunity to ask questions and all were answered. The patient agreed with the plan and demonstrated an understanding of the instructions.  A copy of instructions were sent to the patient via  MyChart unless otherwise noted below.    The patient was advised to call back or seek an in-person evaluation if the symptoms worsen or if the condition fails to improve as anticipated.  Time:  I spent 8 minutes with the patient via telehealth technology discussing the above problems/concerns.    Barbara Daring, PA-C

## 2022-09-07 NOTE — Patient Instructions (Signed)
[name], thank you for joining Mar Daring, PA-C for today's virtual visit.  While this provider is not your primary care provider (PCP), if your PCP is located in our provider database this encounter information will be shared with them immediately following your visit.   [facility] account gives you access to today's visit and all your visits, tests, and labs performed at [facility] " click here if you don't have a [facility] account or go to [email or web address]  Consent: (Patient) Barbara Hooper provided verbal consent for this virtual visit at the beginning of the encounter.  Current Medications:  Current Outpatient Medications:    amoxicillin-clavulanate (AUGMENTIN) 875-125 MG tablet, Take 1 tablet by mouth 2 (two) times daily., Disp: 20 tablet, Rfl: 0   benzonatate (TESSALON) 100 MG capsule, Take 1 capsule (100 mg total) by mouth 3 (three) times daily as needed., Disp: 30 capsule, Rfl: 0   promethazine-dextromethorphan (PROMETHAZINE-DM) 6.25-15 MG/5ML syrup, Take 5 mLs by mouth 4 (four) times daily as needed for cough., Disp: 118 mL, Rfl: 0   escitalopram (LEXAPRO) 10 MG tablet, Take 1 tablet by mouth daily, Disp: 90 tablet, Rfl: 1   etonogestrel (NEXPLANON) 68 MG IMPL implant, 1 each once by Subdermal route., Disp: , Rfl:    fluticasone (FLONASE) 50 MCG/ACT nasal spray, Place 2 sprays into both nostrils daily., Disp: 16 g, Rfl: 0   loratadine (CLARITIN) 10 MG tablet, Take 10 mg by mouth daily., Disp: , Rfl:    omeprazole (PRILOSEC) 20 MG capsule, Take 20 mg by mouth daily., Disp: , Rfl:    Medications ordered in this encounter:  Meds ordered this encounter  Medications   amoxicillin-clavulanate (AUGMENTIN) 875-125 MG tablet    Sig: Take 1 tablet by mouth 2 (two) times daily.    Dispense:  20 tablet    Refill:  0    Order Specific Question:   Supervising Provider    Answer:   Chase Picket [id number]    promethazine-dextromethorphan (PROMETHAZINE-DM) 6.25-15 MG/5ML syrup    Sig: Take 5 mLs by mouth 4 (four) times daily as needed for cough.    Dispense:  118 mL    Refill:  0    Order Specific Question:   Supervising Provider    Answer:   Chase Picket [id number]   benzonatate (TESSALON) 100 MG capsule    Sig: Take 1 capsule (100 mg total) by mouth 3 (three) times daily as needed.    Dispense:  30 capsule    Refill:  0    Order Specific Question:   Supervising Provider    Answer:   Chase Picket [id number]     *If you need refills on other medications prior to your next appointment, please contact your pharmacy*  Follow-Up: Call back or seek an in-person evaluation if the symptoms worsen or if the condition fails to improve as anticipated.  [place] [phone]  Other Instructions  Upper Respiratory Infection, Adult An upper respiratory infection (URI) is a common viral infection of the nose, throat, and upper air passages that lead to the lungs. The most common type of URI is the common cold. URIs usually get better on their own, without medical treatment. What are the causes? A URI is caused by a virus. You may catch a virus by: Breathing in droplets from an infected person's cough or sneeze. Touching something that has been exposed to the virus (is contaminated) and then touching  your mouth, nose, or eyes. What increases the risk? You are more likely to get a URI if: You are very young or very old. You have close contact with others, such as at work, school, or a health care facility. You smoke. You have long-term (chronic) heart or lung disease. You have a weakened disease-fighting system (immune system). You have nasal allergies or asthma. You are experiencing a lot of stress. You have poor nutrition. What are the signs or symptoms? A URI usually involves some of the following symptoms: Runny or stuffy (congested) nose. Cough. Sneezing. Sore  throat. Headache. Fatigue. Fever. Loss of appetite. Pain in your forehead, behind your eyes, and over your cheekbones (sinus pain). Muscle aches. Redness or irritation of the eyes. Pressure in the ears or face. How is this diagnosed? This condition may be diagnosed based on your medical history and symptoms, and a physical exam. Your health care provider may use a swab to take a mucus sample from your nose (nasal swab). This sample can be tested to determine what virus is causing the illness. How is this treated? URIs usually get better on their own within 7-10 days. Medicines cannot cure URIs, but your health care provider may recommend certain medicines to help relieve symptoms, such as: Over-the-counter cold medicines. Cough suppressants. Coughing is a type of defense against infection that helps to clear the respiratory system, so take these medicines only as recommended by your health care provider. Fever-reducing medicines. Follow these instructions at home: Activity Rest as needed. If you have a fever, stay home from work or school until your fever is gone or until your health care provider says your URI cannot spread to other people (is no longer contagious). Your health care provider may have you wear a face mask to prevent your infection from spreading. Relieving symptoms Gargle with a mixture of salt and water 3-4 times a day or as needed. To make salt water, completely dissolve -1 tsp (3-6 g) of salt in 1 cup (237 mL) of warm water. Use a cool-mist humidifier to add moisture to the air. This can help you breathe more easily. Eating and drinking  Drink enough fluid to keep your urine pale yellow. Eat soups and other clear broths. General instructions  Take over-the-counter and prescription medicines only as told by your health care provider. These include cold medicines, fever reducers, and cough suppressants. Do not use any products that contain nicotine or tobacco. These  products include cigarettes, chewing tobacco, and vaping devices, such as e-cigarettes. If you need help quitting, ask your health care provider. Stay away from secondhand smoke. Stay up to date on all immunizations, including the yearly (annual) flu vaccine. Keep all follow-up visits. This is important. How to prevent the spread of infection to others URIs can be contagious. To prevent the infection from spreading: Wash your hands with soap and water for at least 20 seconds. If soap and water are not available, use hand sanitizer. Avoid touching your mouth, face, eyes, or nose. Cough or sneeze into a tissue or your sleeve or elbow instead of into your hand or into the air.  Contact a health care provider if: You are getting worse instead of better. You have a fever or chills. Your mucus is brown or red. You have yellow or brown discharge coming from your nose. You have pain in your face, especially when you bend forward. You have swollen neck glands. You have pain while swallowing. You have white areas in the  back of your throat. Get help right away if: You have shortness of breath that gets worse. You have severe or persistent: Headache. Ear pain. Sinus pain. Chest pain. You have chronic lung disease along with any of the following: Making high-pitched whistling sounds when you breathe, most often when you breathe out (wheezing). Prolonged cough (more than 14 days). Coughing up blood. A change in your usual mucus. You have a stiff neck. You have changes in your: Vision. Hearing. Thinking. Mood. These symptoms may be an emergency. Get help right away. Call 911. Do not wait to see if the symptoms will go away. Do not drive yourself to the hospital. Summary An upper respiratory infection (URI) is a common infection of the nose, throat, and upper air passages that lead to the lungs. A URI is caused by a virus. URIs usually get better on their own within 7-10 days. Medicines  cannot cure URIs, but your health care provider may recommend certain medicines to help relieve symptoms. This information is not intended to replace advice given to you by your health care provider. Make sure you discuss any questions you have with your health care provider. Document Revised: 02/15/2021 Document Reviewed: 02/15/2021 Elsevier Patient Education  [place].    If you have been instructed to have an in-person evaluation today at a local Urgent Care facility, please use the link below. It will take you to a list of all of our available [facility], including address, phone number and hours of operation. Please do not delay care.  [facility]  If you or a family member do not have a primary care provider, use the link below to schedule a visit and establish care. When you choose a [facility] primary care physician or advanced practice provider, you gain a long-term partner in health. Find a Primary Care Provider  Learn more about [facility]'s in-office and virtual care options: [facility]

## 2022-09-25 ENCOUNTER — Ambulatory Visit: Payer: Managed Care, Other (non HMO) | Admitting: Family Medicine

## 2022-09-25 ENCOUNTER — Encounter: Payer: Self-pay | Admitting: Family Medicine

## 2022-09-25 VITALS — BP 129/85 | HR 81 | Ht 64.0 in | Wt 258.0 lb

## 2022-09-25 DIAGNOSIS — R42 Dizziness and giddiness: Secondary | ICD-10-CM | POA: Diagnosis not present

## 2022-09-25 DIAGNOSIS — R519 Headache, unspecified: Secondary | ICD-10-CM | POA: Diagnosis not present

## 2022-09-25 DIAGNOSIS — F411 Generalized anxiety disorder: Secondary | ICD-10-CM | POA: Diagnosis not present

## 2022-09-25 DIAGNOSIS — R635 Abnormal weight gain: Secondary | ICD-10-CM | POA: Diagnosis not present

## 2022-09-25 NOTE — Assessment & Plan Note (Signed)
No abnormalities noted on exam today.  Lab evaluation ordered today.  Additionally, discussed that if labs are normal I would recommend having her see neurologist.

## 2022-09-25 NOTE — Patient Instructions (Addendum)
Lexapro taper:  Reduce lexapro to 1/2 tab x1 weeks then 1/2 tab every other day x2 weeks then stop.   We'll be in touch with lab results.

## 2022-09-25 NOTE — Assessment & Plan Note (Signed)
She will consider weaning from Lexapro.  Given instructions to taper from this.

## 2022-09-25 NOTE — Assessment & Plan Note (Signed)
She has been pretty well with dietary and lifestyle change.  She will continue to see dietitian.  Encouraged continuation of these changes.

## 2022-09-25 NOTE — Progress Notes (Signed)
Barbara Hooper - 27 y.o. female MRN [id number]  Date of birth: 09/25/1995  Subjective Chief Complaint  Patient presents with   Dizziness    HPI Barbara Hooper is a 27 year old female here today for follow-up visit.  She reports she is doing pretty well.  She has met with a dietitian and has been working on dietary changes and incorporation of exercise.  This is very helpful for her and her weight is down about 10 pounds since last visit.  She feels confident about her ability to continue this.  She does report some episodic headaches and feeling of lightheadedness.  Describes this as lightheaded feeling/presyncopal feeling around and then dizziness or sensation of room spinning.  She has not had any palpitations associated with this.  She does report that headaches are intermittent as well.  Feels tingling sensation at times along the left scalp.  No prior history of migraines.  She feels like she is at a point where she would like to try weaning from Lexapro.   ROS:  A comprehensive ROS was completed and negative except as noted per HPI  No Known Allergies  Past Medical History:  Diagnosis Date   Elevated blood pressure    GAD (generalized anxiety disorder) 01/09/2016   Major depression in complete remission ([place]) 05/11/2016    History reviewed. No pertinent surgical history.  Social History   Socioeconomic History   Marital status: Single    Spouse name: Not on file   Number of children: Not on file   Years of education: Not on file   Highest education level: Not on file  Occupational History   Not on file  Tobacco Use   Smoking status: Never   Smokeless tobacco: Never  Vaping Use   Vaping Use: Some days   Substances: Nicotine  Substance and Sexual Activity   Alcohol use: Yes    Alcohol/week: 3.0 standard drinks of alcohol    Types: 3 Glasses of wine per week    Comment: WEEKLY   Drug use: Yes    Types: Marijuana    Comment: OCCASIONALLY   Sexual  activity: Yes    Partners: Male    Birth control/protection: Implant    Comment: Nexplanon  Other Topics Concern   Not on file  Social History Narrative   Not on file   Social Determinants of Health   Financial Resource Strain: Not on file  Food Insecurity: Food Insecurity Present (08/09/2022)   Hunger Vital Sign    Worried About Running Out of Food in the Last Year: Sometimes true    Ran Out of Food in the Last Year: Never true  Transportation Needs: Not on file  Physical Activity: Not on file  Stress: Not on file  Social Connections: Not on file    Family History  Problem Relation Age of Onset   Hypertension Mother    Skin cancer Mother    Hypertension Father    Cancer Maternal Aunt    Cancer Maternal Uncle    Depression Maternal Grandmother    Breast cancer Paternal Aunt    Heart attack Paternal Grandfather    Heart disease Neg Hx     Health Maintenance  Topic Date Due   PAP-Cervical Cytology Screening  09/14/2023   PAP SMEAR-Modifier  09/14/2023   DTaP/Tdap/Td (8 - Td or Tdap) 06/05/2027   INFLUENZA VACCINE  Completed   HPV VACCINES  Completed   COVID-19 Vaccine  Completed   Hepatitis C Screening  Completed  HIV Screening  Completed     ----------------------------------------------------------------------------------------------------------------------------------------------------------------------------------------------------------------- Physical Exam BP 129/85 (BP Location: Left Arm, Patient Position: Sitting, Cuff Size: Large)   Pulse 81   Ht '5\' 4"'$  (1.626 m)   Wt 258 lb (117 kg)   SpO2 100%   BMI 44.29 kg/m   Physical Exam Constitutional:      Appearance: Normal appearance.  HENT:     Head: Normocephalic and atraumatic.  Eyes:     General: No scleral icterus. Cardiovascular:     Rate and Rhythm: Normal rate and regular rhythm.  Pulmonary:     Effort: Pulmonary effort is normal.     Breath sounds: Normal breath sounds.  Neurological:      General: No focal deficit present.     Mental Status: She is alert and oriented to person, place, and time.     Cranial Nerves: No cranial nerve deficit.     Motor: No weakness.     Gait: Gait normal.  Psychiatric:        Mood and Affect: Mood normal.        Behavior: Behavior normal.     ------------------------------------------------------------------------------------------------------------------------------------------------------------------------------------------------------------------- Assessment and Plan  GAD (generalized anxiety disorder) She will consider weaning from Lexapro.  Given instructions to taper from this.  Abnormal weight gain She has been pretty well with dietary and lifestyle change.  She will continue to see dietitian.  Encouraged continuation of these changes.  Episodic lightheadedness No abnormalities noted on exam today.  Lab evaluation ordered today.  Additionally, discussed that if labs are normal I would recommend having her see neurologist.   No orders of the defined types were placed in this encounter.   No follow-ups on file.    This visit occurred during the SARS-CoV-2 public health emergency.  Safety protocols were in place, including screening questions prior to the visit, additional usage of staff PPE, and extensive cleaning of exam room while observing appropriate contact time as indicated for disinfecting solutions.

## 2022-09-26 LAB — HEMOGLOBIN A1C
Hgb A1c MFr Bld: 5.3 % of total Hgb (ref <5.7)
Mean Plasma Glucose: 105 mg/dL
eAG (mmol/L): 5.8 mmol/L

## 2022-09-26 LAB — COMPLETE METABOLIC PANEL WITH GFR
AG Ratio: 1.7 (calc) (ref 1.0–2.5)
ALT: 44 U/L — ABNORMAL HIGH (ref 6–29)
AST: 28 U/L (ref 10–30)
Albumin: 4.8 g/dL (ref 3.6–5.1)
Alkaline phosphatase (APISO): 106 U/L (ref 31–125)
BUN: 7 mg/dL (ref 7–25)
CO2: 28 mmol/L (ref 20–32)
Calcium: 9.9 mg/dL (ref 8.6–10.2)
Chloride: 103 mmol/L (ref 98–110)
Creat: 0.65 mg/dL (ref 0.50–0.96)
Globulin: 2.8 g/dL (calc) (ref 1.9–3.7)
Glucose, Bld: 90 mg/dL (ref 65–99)
Potassium: 4.2 mmol/L (ref 3.5–5.3)
Sodium: 140 mmol/L (ref 135–146)
Total Bilirubin: 0.6 mg/dL (ref 0.2–1.2)
Total Protein: 7.6 g/dL (ref 6.1–8.1)
eGFR: 124 mL/min/{1.73_m2} (ref >=60)

## 2022-09-26 LAB — CBC WITH DIFFERENTIAL/PLATELET
Absolute Monocytes: 487 cells/uL (ref 200–950)
Basophils Absolute: 22 cells/uL (ref 0–200)
Basophils Relative: 0.4 %
Eosinophils Absolute: 78 cells/uL (ref 15–500)
Eosinophils Relative: 1.4 %
HCT: 41.7 % (ref 35.0–45.0)
Hemoglobin: 14.1 g/dL (ref 11.7–15.5)
Lymphs Abs: 1439 cells/uL (ref 850–3900)
MCH: 30.3 pg (ref 27.0–33.0)
MCHC: 33.8 g/dL (ref 32.0–36.0)
MCV: 89.5 fL (ref 80.0–100.0)
MPV: 12.5 fL (ref 7.5–12.5)
Monocytes Relative: 8.7 %
Neutro Abs: 3573 cells/uL (ref 1500–7800)
Neutrophils Relative %: 63.8 %
Platelets: 215 10*3/uL (ref 140–400)
RBC: 4.66 10*6/uL (ref 3.80–5.10)
RDW: 12.6 % (ref 11.0–15.0)
Total Lymphocyte: 25.7 %
WBC: 5.6 10*3/uL (ref 3.8–10.8)

## 2022-09-26 LAB — TSH: TSH: 2.8 mIU/L

## 2022-10-02 ENCOUNTER — Other Ambulatory Visit: Payer: Self-pay | Admitting: Family Medicine

## 2022-10-02 ENCOUNTER — Encounter: Payer: Self-pay | Admitting: Family Medicine

## 2022-10-02 DIAGNOSIS — R55 Syncope and collapse: Secondary | ICD-10-CM

## 2022-10-02 DIAGNOSIS — R030 Elevated blood-pressure reading, without diagnosis of hypertension: Secondary | ICD-10-CM

## 2022-10-02 DIAGNOSIS — R519 Headache, unspecified: Secondary | ICD-10-CM

## 2022-10-02 DIAGNOSIS — R42 Dizziness and giddiness: Secondary | ICD-10-CM

## 2022-10-04 ENCOUNTER — Telehealth: Payer: Self-pay | Admitting: Neurology

## 2022-10-04 ENCOUNTER — Ambulatory Visit: Payer: Managed Care, Other (non HMO) | Admitting: Neurology

## 2022-10-04 ENCOUNTER — Encounter: Payer: Self-pay | Admitting: Neurology

## 2022-10-04 VITALS — BP 146/87 | HR 82 | Ht 64.0 in | Wt 257.4 lb

## 2022-10-04 DIAGNOSIS — R519 Headache, unspecified: Secondary | ICD-10-CM | POA: Diagnosis not present

## 2022-10-04 DIAGNOSIS — R42 Dizziness and giddiness: Secondary | ICD-10-CM | POA: Diagnosis not present

## 2022-10-04 DIAGNOSIS — G8929 Other chronic pain: Secondary | ICD-10-CM | POA: Diagnosis not present

## 2022-10-04 NOTE — Telephone Encounter (Signed)
[employer] sent to [facility] they obtain auth [id number]

## 2022-10-04 NOTE — Progress Notes (Signed)
[facility]  PATIENT: Barbara Hooper DOB: 09-28-1995  REQUESTING CLINICIAN: Luetta Nutting, DO HISTORY FROM: Patient  REASON FOR VISIT: Headaches/Lightheadedness    HISTORICAL  CHIEF COMPLAINT:  Chief Complaint  Patient presents with   New Patient (Initial Visit)    Rm 12, alone.  Around Christmas 2023 dizziness, electric shocks in head, dizziness in January every other day, February every day. No alsways associ with headache.  Headache tension now for 2 wks  Excedrin helps some.  Frontal and occiptal pain.      HISTORY OF PRESENT ILLNESS:  This is a 27 year old woman with history of depression and anxiety who is presenting with complaint of headaches and dizziness since Christmas.  Patient reports the week before Christmas she started having electric sensation in the brain followed by dizziness.  These events were happening multiple times a week.  Then in January she started having headaches.  And again it was started with dizziness described as lightheadedness, no room spinning sensation and no passing out this is followed by headache.  She described the headache as pressure type headache.  Headache can happen in the frontal region.   On top of this she is also having a different type of headache that she describes as occipital and frontal headache behind her eyes and this specific headache started 2 weeks ago and has been nonstop. She does reports sensitivity to light with the headaches but no nausea, no vomiting.     OTHER MEDICAL CONDITIONS: Depression/Anxiety   REVIEW OF SYSTEMS: Full 14 system review of systems performed and negative with exception of: As noted in the HPI   ALLERGIES: No Known Allergies  HOME MEDICATIONS: Outpatient Medications Prior to Visit  Medication Sig Dispense Refill   aspirin-acetaminophen-caffeine (EXCEDRIN MIGRAINE) 250-250-65 MG tablet Take 1 tablet by mouth every 6 (six) hours as needed for headache.     escitalopram  (LEXAPRO) 10 MG tablet Take 1 tablet by mouth daily 90 tablet 1   etonogestrel (NEXPLANON) 68 MG IMPL implant 1 each once by Subdermal route.     fluticasone (FLONASE) 50 MCG/ACT nasal spray Place 2 sprays into both nostrils daily. 16 g 0   omeprazole (PRILOSEC) 20 MG capsule Take 20 mg by mouth daily.     No facility-administered medications prior to visit.    PAST MEDICAL HISTORY: Past Medical History:  Diagnosis Date   Elevated blood pressure    GAD (generalized anxiety disorder) 01/09/2016   Major depression in complete remission ([place]) 05/11/2016    PAST SURGICAL HISTORY: History reviewed. No pertinent surgical history.  FAMILY HISTORY: Family History  Problem Relation Age of Onset   Hypertension Mother    Skin cancer Mother    Glaucoma Mother    Hypertension Father    Cancer Maternal Aunt    Cancer Maternal Uncle    Breast cancer Paternal Aunt    Depression Maternal Grandmother    Heart attack Paternal Grandfather    Heart disease Neg Hx     SOCIAL HISTORY: Social History   Socioeconomic History   Marital status: Single    Spouse name: Not on file   Number of children: Not on file   Years of education: Not on file   Highest education level: Not on file  Occupational History   Not on file  Tobacco Use   Smoking status: Never   Smokeless tobacco: Never  Vaping Use   Vaping Use: Some days   Substances: Nicotine  Substance and Sexual Activity  Alcohol use: Yes    Alcohol/week: 7.0 standard drinks of alcohol    Types: 7 Glasses of wine per week    Comment: WEEKLY   Drug use: Not Currently    Types: Marijuana    Comment: OCCASIONALLY, stoppped in January   Sexual activity: Yes    Partners: Male    Birth control/protection: Implant    Comment: Nexplanon  Other Topics Concern   Not on file  Social History Narrative   Caffiene 2 cups daily   Social Determinants of Health   Financial Resource Strain: Not on file  Food Insecurity: Food Insecurity  Present (08/09/2022)   Hunger Vital Sign    Worried About Running Out of Food in the Last Year: Sometimes true    Ran Out of Food in the Last Year: Never true  Transportation Needs: Not on file  Physical Activity: Not on file  Stress: Not on file  Social Connections: Not on file  Intimate Partner Violence: Not on file     PHYSICAL EXAM  GENERAL EXAM/CONSTITUTIONAL: Vitals:  Vitals:   10/04/22 1025  BP: (!) 146/87  Pulse: 82  Weight: 257 lb 6.4 oz (116.8 kg)  Height: '5\' 4"'$  (1.626 m)   Body mass index is 44.18 kg/m. Wt Readings from Last 3 Encounters:  10/04/22 257 lb 6.4 oz (116.8 kg)  09/25/22 258 lb (117 kg)  08/09/22 264 lb 12.8 oz (120.1 kg)   Patient is in no distress; well developed, nourished and groomed; neck is supple  EYES: Visual fields full to confrontation, Extraocular movements intacts,   MUSCULOSKELETAL: Gait, strength, tone, movements noted in Neurologic exam below  NEUROLOGIC: MENTAL STATUS:      No data to display         awake, alert, oriented to person, place and time recent and remote memory intact normal attention and concentration language fluent, comprehension intact, naming intact fund of knowledge appropriate  CRANIAL NERVE:  2nd, 3rd, 4th, 6th - Pupil round and reactive to light, Visual fields full to confrontation, extraocular muscles intact, no nystagmus, normal funduscopic exam 5th - facial sensation symmetric 7th - facial strength symmetric 8th - hearing intact 9th - palate elevates symmetrically, uvula midline 11th - shoulder shrug symmetric 12th - tongue protrusion midline  MOTOR:  normal bulk and tone, full strength in the BUE, BLE  SENSORY:  normal and symmetric to light touch  COORDINATION:  finger-nose-finger, fine finger movements normal  REFLEXES:  deep tendon reflexes present and symmetric  GAIT/STATION:  normal   DIAGNOSTIC DATA (LABS, IMAGING, TESTING) - I reviewed patient records, labs, notes,  testing and imaging myself where available.  Lab Results  Component Value Date   WBC 5.6 09/25/2022   HGB 14.1 09/25/2022   HCT 41.7 09/25/2022   MCV 89.5 09/25/2022   PLT 215 09/25/2022      Component Value Date/Time   NA 140 09/25/2022 1001   NA 136 (A) 03/05/2014 0000   K 4.2 09/25/2022 1001   CL 103 09/25/2022 1001   CO2 28 09/25/2022 1001   GLUCOSE 90 09/25/2022 1001   BUN 7 09/25/2022 1001   CREATININE 0.65 09/25/2022 1001   CALCIUM 9.9 09/25/2022 1001   PROT 7.6 09/25/2022 1001   ALBUMIN 4.4 02/13/2016 0936   AST 28 09/25/2022 1001   ALT 44 (H) 09/25/2022 1001   ALKPHOS 102 02/13/2016 0936   BILITOT 0.6 09/25/2022 1001   GFRNONAA 126 09/19/2020 0000   GFRAA 146 09/19/2020 0000   Lab Results  Component Value Date   CHOL 161 09/19/2020   HDL 44 (L) 09/19/2020   LDLCALC 102 (H) 09/19/2020   TRIG 66 09/19/2020   CHOLHDL 3.7 09/19/2020   Lab Results  Component Value Date   HGBA1C 5.3 09/25/2022   No results found for: "VITAMINB12" Lab Results  Component Value Date   TSH 2.80 09/25/2022      ASSESSMENT AND PLAN  27 y.o. year old female with anxiety and depression who is presenting with 3 to 16-month history of headaches, described the headaches are pressure headaches, also having lightheadedness and floaters.  She denies any migrainous features.  First I would like to obtain an MRI brain with and without contrast to look for evidence of increased intracranial hypertension.  If positive, will send patient to radiology for LP.  If MRI negative for any evidence of IIH, will treat these headache as tension type headache.  This was discussed with patient and she is comfortable with plans.  I will see her in 6 months for follow-up.   1. Chronic nonintractable headache, unspecified headache type   2. Dizziness      Patient Instructions  MRI brain with and without without contrast Trial of ibuprofen or Tylenol as needed for the headaches, can also take Excedrin  Migraine as needed but no more than 3 days a week.   Follow-up in 6 months or sooner if worse.  Orders Placed This Encounter  Procedures   MR BRAIN W WO CONTRAST    No orders of the defined types were placed in this encounter.   Return in about 6 months (around 04/06/2023).    AAlric Ran MD 10/04/2022, 4:32 PM  [facility] [id number] [place]al[place]Justice [place] [phone]

## 2022-10-04 NOTE — Patient Instructions (Addendum)
MRI brain with and without without contrast Trial of ibuprofen or Tylenol as needed for the headaches, can also take Excedrin Migraine as needed but no more than 3 days a week.   Follow-up in 6 months or sooner if worse.

## 2022-10-05 ENCOUNTER — Encounter (HOSPITAL_COMMUNITY): Payer: Self-pay

## 2022-10-05 ENCOUNTER — Ambulatory Visit
Admission: EM | Admit: 2022-10-05 | Discharge: 2022-10-05 | Disposition: A | Discharge status: Home / Self Care | Payer: Managed Care, Other (non HMO) | Attending: Urgent Care | Admitting: Urgent Care

## 2022-10-05 ENCOUNTER — Emergency Department (HOSPITAL_COMMUNITY)
Admission: EM | Admit: 2022-10-05 | Discharge: 2022-10-05 | Disposition: A | Discharge status: Home / Self Care | Payer: Managed Care, Other (non HMO) | Attending: Emergency Medicine | Admitting: Emergency Medicine

## 2022-10-05 ENCOUNTER — Other Ambulatory Visit: Payer: Self-pay

## 2022-10-05 ENCOUNTER — Encounter: Payer: Self-pay | Admitting: Emergency Medicine

## 2022-10-05 DIAGNOSIS — R202 Paresthesia of skin: Secondary | ICD-10-CM | POA: Diagnosis present

## 2022-10-05 DIAGNOSIS — R42 Dizziness and giddiness: Secondary | ICD-10-CM

## 2022-10-05 DIAGNOSIS — R55 Syncope and collapse: Secondary | ICD-10-CM

## 2022-10-05 DIAGNOSIS — Z7982 Long term (current) use of aspirin: Secondary | ICD-10-CM | POA: Diagnosis not present

## 2022-10-05 LAB — URINALYSIS, ROUTINE W REFLEX MICROSCOPIC
Bilirubin Urine: NEGATIVE
Glucose, UA: NEGATIVE mg/dL
Hgb urine dipstick: NEGATIVE
Ketones, ur: 5 mg/dL — AB
Leukocytes,Ua: NEGATIVE
Nitrite: NEGATIVE
Protein, ur: NEGATIVE mg/dL
Specific Gravity, Urine: 1.014 (ref 1.005–1.030)
pH: 6 (ref 5.0–8.0)

## 2022-10-05 LAB — CBC WITH DIFFERENTIAL/PLATELET
Abs Immature Granulocytes: 0.01 10*3/uL (ref 0.00–0.07)
Basophils Absolute: 0 10*3/uL (ref 0.0–0.1)
Basophils Relative: 0 %
Eosinophils Absolute: 0 10*3/uL (ref 0.0–0.5)
Eosinophils Relative: 0 %
HCT: 41.9 % (ref 36.0–46.0)
Hemoglobin: 14.1 g/dL (ref 12.0–15.0)
Immature Granulocytes: 0 %
Lymphocytes Relative: 20 %
Lymphs Abs: 1.8 10*3/uL (ref 0.7–4.0)
MCH: 30.2 pg (ref 26.0–34.0)
MCHC: 33.7 g/dL (ref 30.0–36.0)
MCV: 89.7 fL (ref 80.0–100.0)
Monocytes Absolute: 0.6 10*3/uL (ref 0.1–1.0)
Monocytes Relative: 7 %
Neutro Abs: 6.3 10*3/uL (ref 1.7–7.7)
Neutrophils Relative %: 73 %
Platelets: 216 10*3/uL (ref 150–400)
RBC: 4.67 MIL/uL (ref 3.87–5.11)
RDW: 12.2 % (ref 11.5–15.5)
WBC: 8.7 10*3/uL (ref 4.0–10.5)
nRBC: 0 % (ref 0.0–0.2)

## 2022-10-05 LAB — COMPREHENSIVE METABOLIC PANEL
ALT: 43 U/L (ref 0–44)
AST: 32 U/L (ref 15–41)
Albumin: 4.5 g/dL (ref 3.5–5.0)
Alkaline Phosphatase: 80 U/L (ref 38–126)
Anion gap: 10 (ref 5–15)
BUN: 5 mg/dL — ABNORMAL LOW (ref 6–20)
CO2: 22 mmol/L (ref 22–32)
Calcium: 9.1 mg/dL (ref 8.9–10.3)
Chloride: 105 mmol/L (ref 98–111)
Creatinine, Ser: 0.6 mg/dL (ref 0.44–1.00)
GFR, Estimated: >60 mL/min (ref >60)
Glucose, Bld: 99 mg/dL (ref 70–99)
Potassium: 3.5 mmol/L (ref 3.5–5.1)
Sodium: 137 mmol/L (ref 135–145)
Total Bilirubin: 0.7 mg/dL (ref 0.3–1.2)
Total Protein: 7.6 g/dL (ref 6.5–8.1)

## 2022-10-05 LAB — I-STAT BETA HCG BLOOD, ED (MC, WL, AP ONLY): I-stat hCG, quantitative: <5 m[IU]/mL (ref <5)

## 2022-10-05 LAB — TSH: TSH: 4.064 u[IU]/mL (ref 0.350–4.500)

## 2022-10-05 LAB — CBG MONITORING, ED: Glucose-Capillary: 95 mg/dL (ref 70–99)

## 2022-10-05 LAB — PREGNANCY, URINE: Preg Test, Ur: NEGATIVE

## 2022-10-05 NOTE — ED Provider Notes (Signed)
[place]  Note:  This document was prepared using Dragon voice recognition software and may include unintentional dictation errors.  MRN: [id number] DOB: 1995-12-30  Subjective:   Barbara Hooper is a 27 y.o. female presenting for an evaluation following an episode of near syncope, dizziness, cold chills, numbness and itching all over the face and head.  Symptoms started this morning while at work.  Vital signs were taking and had an elevated blood pressure of 176/106 with a pulse of 123.  She has actually been undergoing workup for this with her PCP and currently has an MRI scheduled for the end of the month.  She was just seen by neurology as she has had headaches as well as episodes of dizziness.  Her PCP has considered POTS but no current workup has been done for this.  No current facility-administered medications for this encounter.  Current Outpatient Medications:    aspirin-acetaminophen-caffeine (EXCEDRIN MIGRAINE) 250-250-65 MG tablet, Take 1 tablet by mouth every 6 (six) hours as needed for headache., Disp: , Rfl:    escitalopram (LEXAPRO) 10 MG tablet, Take 1 tablet by mouth daily, Disp: 90 tablet, Rfl: 1   etonogestrel (NEXPLANON) 68 MG IMPL implant, 1 each once by Subdermal route., Disp: , Rfl:    fluticasone (FLONASE) 50 MCG/ACT nasal spray, Place 2 sprays into both nostrils daily., Disp: 16 g, Rfl: 0   omeprazole (PRILOSEC) 20 MG capsule, Take 20 mg by mouth daily., Disp: , Rfl:    No Known Allergies  Past Medical History:  Diagnosis Date   Elevated blood pressure    GAD (generalized anxiety disorder) 01/09/2016   Major depression in complete remission ([place]) 05/11/2016     History reviewed. No pertinent surgical history.  Family History  Problem Relation Age of Onset   Hypertension Mother    Skin cancer Mother    Glaucoma Mother    Hypertension Father    Cancer Maternal Aunt    Cancer Maternal Uncle    Breast cancer Paternal Aunt     Depression Maternal Grandmother    Heart attack Paternal Grandfather    Heart disease Neg Hx     Social History   Tobacco Use   Smoking status: Never   Smokeless tobacco: Never  Vaping Use   Vaping Use: Some days   Substances: Nicotine  Substance Use Topics   Alcohol use: Yes    Alcohol/week: 7.0 standard drinks of alcohol    Types: 7 Glasses of wine per week    Comment: WEEKLY   Drug use: Not Currently    Types: Marijuana    Comment: OCCASIONALLY, stoppped in January    ROS   Objective:   Vitals: BP (!) 142/86 (BP Location: Right Arm)   Pulse 77   Temp 98.4 F (36.9 C) (Oral)   Resp 16   Ht '5\' 4"'$  (1.626 m)   Wt 257 lb (116.6 kg)   SpO2 98%   BMI 44.11 kg/m   Orthostatic VS for the past 24 hrs:  BP- Lying Pulse- Lying BP- Sitting Pulse- Sitting BP- Standing at 0 minutes Pulse- Standing at 0 minutes  10/05/22 1142 135/81 66 141/83 85 126/85 94    Physical Exam Constitutional:      General: She is not in acute distress.    Appearance: Normal appearance. She is well-developed and normal weight. She is not ill-appearing, toxic-appearing or diaphoretic.  HENT:     Head: Normocephalic and atraumatic.     Right Ear:  Tympanic membrane, ear canal and external ear normal. No drainage or tenderness. No middle ear effusion. There is no impacted cerumen. Tympanic membrane is not erythematous or bulging.     Left Ear: Tympanic membrane, ear canal and external ear normal. No drainage or tenderness.  No middle ear effusion. There is no impacted cerumen. Tympanic membrane is not erythematous or bulging.     Nose: Nose normal. No congestion or rhinorrhea.     Mouth/Throat:     Mouth: Mucous membranes are moist. No oral lesions.     Pharynx: No pharyngeal swelling, oropharyngeal exudate, posterior oropharyngeal erythema or uvula swelling.     Tonsils: No tonsillar exudate or tonsillar abscesses.  Eyes:     General: No scleral icterus.       Right eye: No discharge.         Left eye: No discharge.     Extraocular Movements: Extraocular movements intact.     Right eye: Normal extraocular motion.     Left eye: Normal extraocular motion.     Conjunctiva/sclera: Conjunctivae normal.  Neck:     Meningeal: Brudzinski's sign and Kernig's sign absent.  Cardiovascular:     Rate and Rhythm: Normal rate and regular rhythm.     Heart sounds: Normal heart sounds. No murmur heard.    No friction rub. No gallop.  Pulmonary:     Effort: Pulmonary effort is normal. No respiratory distress.     Breath sounds: No stridor. No wheezing, rhonchi or rales.  Chest:     Chest wall: No tenderness.  Musculoskeletal:     Cervical back: Normal range of motion and neck supple.  Lymphadenopathy:     Cervical: No cervical adenopathy.  Skin:    General: Skin is warm and dry.  Neurological:     General: No focal deficit present.     Mental Status: She is alert and oriented to person, place, and time.     Cranial Nerves: No cranial nerve deficit, dysarthria or facial asymmetry.     Motor: No weakness or pronator drift.     Coordination: Romberg sign negative. Coordination normal. Finger-Nose-Finger Test and Heel to [facility] Test normal. Rapid alternating movements normal.     Gait: Gait and tandem walk normal.     Deep Tendon Reflexes: Reflexes normal.  Psychiatric:        Mood and Affect: Mood normal.        Behavior: Behavior normal.        Thought Content: Thought content normal.        Judgment: Judgment normal.    ED ECG REPORT   Date: 10/05/2022  Rate: 87bpm  Rhythm: normal sinus rhythm  QRS Axis: normal  Intervals: normal  ST/T Wave abnormalities: nonspecific T wave changes  Conduction Disutrbances:none  Narrative Interpretation: Sinus rhythm at 87 bpm with nonspecific T wave flattening in lead III.  No previous EKG available for comparison.  There is a sinus arrhythmia which may correspond with inspiration and expiration.  Old EKG Reviewed: none available  I have  personally reviewed the EKG tracing and agree with the computerized printout as noted.   Assessment and Plan :   PDMP not reviewed this encounter.  1. Dizziness   2. Near syncope     Continue workup with neurology group.  Follow-up with PCP to consider referral to cardiology for continued workup.  Otherwise, patient has reassuring hemodynamically stable vital signs, EKG is equivocal. Counseled patient on potential for adverse effects with medications  prescribed/recommended today, ER and return-to-clinic precautions discussed, patient verbalized understanding.    Jaynee Eagles, [place] 10/05/22 1157

## 2022-10-05 NOTE — ED Provider Notes (Signed)
[facility] Provider Note   CSN: [id number] Arrival date & time: 10/05/22  1948     History  Chief Complaint  Patient presents with   Dizziness    Barbara Hooper is a 27 y.o. female.  Patient is a 27 year old female with no known past medical history presenting to the emergency department with dizziness and paresthesias.  The patient states that she has had frequent headaches and dizziness for the last 2 to 3 months.  She has been seen by her primary doctor and neurology and she states that she is being evaluated for IIH.  She states that today while at work she was sitting at her desk when she suddenly felt dizzy like she was about to pass out.  She states that she went to urgent care and was evaluated and her workup was negative and she went home and rested.  She states that then this evening she started to feel paresthesias throughout her face.  She denies any associated weakness or numbness or tingling in her arms or legs, vision changes or speech disturbances.  She states that she has not had a headache today.  He denies any recent nausea, vomiting or diarrhea.   Dizziness      Home Medications Prior to Admission medications   Medication Sig Start Date End Date Taking? Authorizing Provider  aspirin-acetaminophen-caffeine (EXCEDRIN MIGRAINE) [id number] MG tablet Take 1 tablet by mouth every 6 (six) hours as needed for headache.    [provider]  escitalopram (LEXAPRO) 10 MG tablet Take 1 tablet by mouth daily 06/25/22   Luetta Nutting, DO  etonogestrel (NEXPLANON) 68 MG IMPL implant 1 each once by Subdermal route.    [provider]  fluticasone (FLONASE) 50 MCG/ACT nasal spray Place 2 sprays into both nostrils daily. 08/12/21   Gildardo Pounds, NP  omeprazole (PRILOSEC) 20 MG capsule Take 20 mg by mouth daily.    [provider]      Allergies    Patient has no known allergies.    Review of Systems    Review of Systems  Neurological:  Positive for dizziness.    Physical Exam Updated Vital Signs BP (!) 156/94   Pulse (!) 104   Temp 99 F (37.2 C) (Oral)   Resp 18   Ht '5\' 4"'$  (1.626 m)   Wt 116.6 kg   SpO2 96%   BMI 44.11 kg/m  Physical Exam Vitals and nursing note reviewed.  Constitutional:      General: She is not in acute distress.    Appearance: Normal appearance.  HENT:     Head: Normocephalic and atraumatic.     Nose: Nose normal.     Mouth/Throat:     Mouth: Mucous membranes are moist.     Pharynx: Oropharynx is clear.  Eyes:     Extraocular Movements: Extraocular movements intact.     Conjunctiva/sclera: Conjunctivae normal.     Pupils: Pupils are equal, round, and reactive to light.     Comments: No nystagmus  Cardiovascular:     Rate and Rhythm: Normal rate and regular rhythm.     Heart sounds: Normal heart sounds.  Pulmonary:     Effort: Pulmonary effort is normal.     Breath sounds: Normal breath sounds.  Abdominal:     General: Abdomen is flat.     Palpations: Abdomen is soft.     Tenderness: There is no abdominal tenderness.  Musculoskeletal:  General: Normal range of motion.     Cervical back: Normal range of motion and neck supple.  Skin:    General: Skin is warm and dry.  Neurological:     General: No focal deficit present.     Mental Status: She is alert and oriented to person, place, and time.     Cranial Nerves: No cranial nerve deficit.     Sensory: No sensory deficit (subjectively decreased sensation diffusely on face bilaterally).     Motor: No weakness.     Coordination: Coordination normal.     Comments: No drift in all 4 extremities  Psychiatric:        Mood and Affect: Mood normal.        Behavior: Behavior normal.     ED Results / Procedures / Treatments   Labs (all labs ordered are listed, but only abnormal results are displayed) Labs Reviewed  COMPREHENSIVE METABOLIC PANEL - Abnormal; Notable for the following  components:      Result Value   BUN 5 (*)    All other components within normal limits  URINALYSIS, ROUTINE W REFLEX MICROSCOPIC - Abnormal; Notable for the following components:   Ketones, ur 5 (*)    All other components within normal limits  CBC WITH DIFFERENTIAL/PLATELET  TSH  PREGNANCY, URINE  CBG MONITORING, ED  I-STAT BETA HCG BLOOD, ED ([facility], [facility], [facility] ONLY)    EKG EKG Interpretation  Date/Time:  Friday October 05 2022 20:10:04 EST Ventricular Rate:  99 PR Interval:  137 QRS Duration: 85 QT Interval:  337 QTC Calculation: 433 R Axis:   72 Text Interpretation: Sinus rhythm Borderline T wave abnormalities No significant change since last tracing earlier today Confirmed by Leanord Asal ([id number]) on 10/05/2022 9:17:13 PM  Radiology No results found.  Procedures Procedures    Medications Ordered in ED Medications - No data to display  ED Course/ Medical Decision Making/ A&P                             Medical Decision Making This patient presents to the ED with chief complaint(s) of paresthesias, dizziness with no pertinent past medical history which further complicates the presenting complaint. The complaint involves an extensive differential diagnosis and also carries with it a high risk of complications and morbidity.    The differential diagnosis includes arrhythmia, electrolyte abnormality, anemia, patient has no focal neurologic deficits making CVA unlikely, she has no rash or lesions making shingles unlikely  Additional history obtained: Additional history obtained from spouse Records reviewed Primary Care Documents and neurology records  ED Course and Reassessment: Patient was initially evaluated by provider in triage and had EKG and labs performed.  EKG showed normal sinus rhythm without ischemic changes.  Labs showed no signs of anemia, electrolytes within normal range, pregnancy test negative, thyroid function normal.  The patient has no focal neurologic  deficits on exam.  Is unclear what is causing the patient's symptoms at this time but she is stable for discharge home with outpatient neurology and cardiology follow-up as scheduled.  She was given strict return precautions.  Independent labs interpretation:  The following labs were independently interpreted: within normal range  Independent visualization of imaging: N/A  Consultation: - Consulted or discussed management/test interpretation w/ external professional: N/A  Consideration for admission or further workup: Patient has no emergent conditions requiring admission or further work-up at this time and is stable for discharge home with  primary care follow-up  Social Determinants of health: N/A            Final Clinical Impression(s) / ED Diagnoses Final diagnoses:  Paresthesias  Dizziness    Rx / DC Orders ED Discharge Orders     None         Kemper Durie, DO 10/05/22 2210

## 2022-10-05 NOTE — ED Triage Notes (Signed)
States that she has been having episode of near syncope, and dizziness. Has been seen by PCP and a neurologist for this. States that she had another episode this morning, was seen a UC, had blood work, states that he BP was elevated and she had tingling and numbness in her face bilaterally was told to go home and rest, has a scheduled MRI for 10/25/2022

## 2022-10-05 NOTE — ED Triage Notes (Signed)
Patient has been seeing a neurologists due to dizzy spells; seen neuro yesterday scheduled for MRI of the head on 10/25/2022.  While at work this morning, started feeling dizzy, face felt cold, numb/itchy spots all over face, feeling of syncope.  BP was taken at work after episode, it was 176 106 P: 123.

## 2022-10-05 NOTE — ED Provider Triage Note (Signed)
Emergency Medicine Provider Triage Evaluation Note  Barbara Hooper , a 27 y.o. female  was evaluated in triage.  Pt complains of near syncope and facial numbness.  For the past 2 months patient has been having episodes of lightheadedness and near syncope.  She was seen by outpatient neurology and is scheduled for an MRI of the brain on March 28.  Patient states that she was seen at an urgent care few days ago for the same thing after having an episode where she felt like she was going to Pass out.  Her blood pressure was markedly elevated.  It had improved by the time she got to the urgent care.  She was told to return to emergency department if she developed numbness or new neurologic disorders.  Patient states that today prior to the onset of her near syncopal event she developed numbness on the left and right side of her face.  This resolved but returned this evening and now she complains of numbness over the left upper portion of her face around the left ear and a strip of numbness along the right side of her nose and right cheek.  These are new.  She does have a history of anxiety but states that this feels different..  Review of Systems  Positive: Numbness, near syncope Negative: Headache  Physical Exam  BP (!) 156/94   Pulse (!) 104   Temp 99 F (37.2 C) (Oral)   Resp 18   Ht '5\' 4"'$  (1.626 m)   Wt 116.6 kg   SpO2 96%   BMI 44.11 kg/m  Gen:   Awake, no distress  Resp:  Normal effort  MSK:   Moves extremities without difficulty  Other:  No obvious neurologic deficits  Medical Decision Making  Medically screening exam initiated at 8:10 PM.  Appropriate orders placed.  [name] was informed that the remainder of the evaluation will be completed by another provider, this initial triage assessment does not replace that evaluation, and the importance of remaining in the ED until their evaluation is complete.     Margarita Mail, PA-C 10/05/22 2012

## 2022-10-05 NOTE — Discharge Instructions (Signed)
You were seen in the emergency department for your dizziness and your tingling to your face.  Your workup showed no signs of stroke, your electrolytes were normal, you are not anemic and your thyroid function was normal.  It is unclear what is causing your symptoms at this time however you should follow-up with neurology, cardiology and your primary doctor as planned.  You should return to the emergency department if you are having numbness or weakness on one side of the body compared to the other, you are losing vision, you have slurred speech, you pass out or if you have any other new or concerning symptoms.

## 2022-10-07 ENCOUNTER — Other Ambulatory Visit: Payer: Managed Care, Other (non HMO)

## 2022-10-10 ENCOUNTER — Telehealth: Payer: Self-pay | Admitting: General Practice

## 2022-10-10 NOTE — Transitions of Care (Post Inpatient/ED Visit) (Signed)
   10/10/2022  Name: Barbara Hooper MRN: [id number] DOB: [birth year]  Today's TOC FU Call Status: Today's TOC FU Call Status:: Successful TOC FU Call Competed TOC FU Call Complete Date: 10/10/22  Transition Care Management Follow-up Telephone Call Date of Discharge: 10/08/22 Discharge Facility: Other ([facility]) Name of Other (Non[facility]) Discharge Facility: [facility] Type of Discharge: Emergency Department Reason for ED Visit: Neurologic (headache) Neurologic Diagnosis:  (parasthesias/ headache) How have you been since you were released from the hospital?: Better Any questions or concerns?: No  Items Reviewed: Did you receive and understand the discharge instructions provided?: Yes Medications obtained and verified?: Yes (Medications Reviewed) Any new allergies since your discharge?: No Dietary orders reviewed?: NA Do you have support at home?: Yes  Home Care and Equipment/Supplies: [facility] Ordered?: No Any new equipment or medical supplies ordered?: No  Functional Questionnaire: Do you need assistance with bathing/showering or dressing?: No Do you need assistance with meal preparation?: No Do you need assistance with eating?: No Do you have difficulty maintaining continence: No Do you need assistance with getting out of bed/getting out of a chair/moving?: No Do you have difficulty managing or taking your medications?: No  Folllow up appointments reviewed: PCP Follow-up appointment confirmed?: Yes Date of PCP follow-up appointment?: 10/11/22 Follow-up Provider: Dr. Zigmund Daniel. Specialist Hospital Follow-up appointment confirmed?: Yes Date of Specialist follow-up appointment?: 10/27/22 Follow-Up Specialty Provider:: Cardiologist and then 6 months for neurology. Do you need transportation to your follow-up appointment?: No Do you understand care options if your condition(s) worsen?: Yes-patient verbalized understanding    SIGNATURE: Tinnie Gens, RN  BSN

## 2022-10-11 ENCOUNTER — Ambulatory Visit: Payer: Managed Care, Other (non HMO) | Admitting: Family Medicine

## 2022-10-11 ENCOUNTER — Encounter: Payer: Self-pay | Admitting: Family Medicine

## 2022-10-11 VITALS — BP 130/80 | HR 65 | Ht 64.0 in | Wt 253.0 lb

## 2022-10-11 DIAGNOSIS — R232 Flushing: Secondary | ICD-10-CM

## 2022-10-11 DIAGNOSIS — R519 Headache, unspecified: Secondary | ICD-10-CM

## 2022-10-12 ENCOUNTER — Ambulatory Visit
Admission: RE | Admit: 2022-10-12 | Discharge: 2022-10-12 | Disposition: A | Discharge status: Home / Self Care | Payer: Managed Care, Other (non HMO) | Source: Ambulatory Visit | Attending: Neurology

## 2022-10-12 DIAGNOSIS — R42 Dizziness and giddiness: Secondary | ICD-10-CM | POA: Diagnosis not present

## 2022-10-12 DIAGNOSIS — G8929 Other chronic pain: Secondary | ICD-10-CM

## 2022-10-12 DIAGNOSIS — R519 Headache, unspecified: Secondary | ICD-10-CM | POA: Diagnosis not present

## 2022-10-12 MED ORDER — GADOPICLENOL 0.5 MMOL/ML IV SOLN
10.0000 mL | Freq: Once | INTRAVENOUS | Status: AC | PRN
  Administered 2022-10-12: 10 mL via INTRAVENOUS

## 2022-10-14 DIAGNOSIS — R519 Headache, unspecified: Secondary | ICD-10-CM | POA: Insufficient documentation

## 2022-10-14 NOTE — Assessment & Plan Note (Signed)
She continues to have frequent headaches as well as presyncopal symptoms and flushing sensation.  She has seen neurology and has upcoming MRI.  Checking catecholamines, metanephrines and 5 HIAA.

## 2022-10-14 NOTE — Progress Notes (Signed)
Barbara Hooper - 27 y.o. female MRN [id number]  Date of birth: Aug 20, 1995  Subjective Chief Complaint  Patient presents with   Hospitalization Follow-up    HPI Barbara Hooper is a 27 year old female here today for follow-up.  Recently seen in the ER due to low some worsening of symptoms she has been experiencing recently.  She noted some facial paresthesias as well as palpitations associated with feeling of near syncope.  No significant abnormalities noted in the ED.  She has seen neurology recently and has MRI scheduled as part of IIH workup.  Currently wearing Zio patch.  She continues to have episodic headaches with presyncopal feeling, flushing sensation across her face and palpitations.  ROS:  A comprehensive ROS was completed and negative except as noted per HPI  No Known Allergies  Past Medical History:  Diagnosis Date   Elevated blood pressure    GAD (generalized anxiety disorder) 01/09/2016   Major depression in complete remission ([facility]) 05/11/2016    History reviewed. No pertinent surgical history.  Social History   Socioeconomic History   Marital status: Single    Spouse name: Not on file   Number of children: Not on file   Years of education: Not on file   Highest education level: Not on file  Occupational History   Not on file  Tobacco Use   Smoking status: Never   Smokeless tobacco: Never  Vaping Use   Vaping Use: Some days   Substances: Nicotine  Substance and Sexual Activity   Alcohol use: Yes    Alcohol/week: 7.0 standard drinks of alcohol    Types: 7 Glasses of wine per week    Comment: WEEKLY   Drug use: Not Currently    Types: Marijuana    Comment: OCCASIONALLY, stoppped in January   Sexual activity: Yes    Partners: Male    Birth control/protection: Implant    Comment: Nexplanon  Other Topics Concern   Not on file  Social History Narrative   Caffiene 2 cups daily   Social Determinants of Health   Financial Resource Strain: Not on file  Food  Insecurity: Food Insecurity Present (08/09/2022)   Hunger Vital Sign    Worried About Running Out of Food in the Last Year: Sometimes true    Ran Out of Food in the Last Year: Never true  Transportation Needs: Not on file  Physical Activity: Not on file  Stress: Not on file  Social Connections: Not on file    Family History  Problem Relation Age of Onset   Hypertension Mother    Skin cancer Mother    Glaucoma Mother    Hypertension Father    Cancer Maternal Aunt    Cancer Maternal Uncle    Breast cancer Paternal Aunt    Depression Maternal Grandmother    Heart attack Paternal Grandfather    Heart disease Neg Hx     Health Maintenance  Topic Date Due   PAP-Cervical Cytology Screening  09/14/2023   PAP SMEAR-Modifier  09/14/2023   DTaP/Tdap/Td (8 - Td or Tdap) 06/05/2027   INFLUENZA VACCINE  Completed   HPV VACCINES  Completed   COVID-19 Vaccine  Completed   Hepatitis C Screening  Completed   HIV Screening  Completed     ----------------------------------------------------------------------------------------------------------------------------------------------------------------------------------------------------------------- Physical Exam BP 130/80 (BP Location: Left Arm, Patient Position: Sitting, Cuff Size: Normal)   Pulse 65   Ht 5\' 4"  (1.626 m)   Wt 253 lb (114.8 kg)   SpO2 99%  BMI 43.43 kg/m   Physical Exam Constitutional:      Appearance: Normal appearance.  HENT:     Head: Normocephalic and atraumatic.  Eyes:     General: No scleral icterus. Cardiovascular:     Rate and Rhythm: Normal rate and regular rhythm.  Pulmonary:     Effort: Pulmonary effort is normal.     Breath sounds: Normal breath sounds.  Neurological:     Mental Status: She is alert.  Psychiatric:        Mood and Affect: Mood normal.        Behavior: Behavior normal.      ------------------------------------------------------------------------------------------------------------------------------------------------------------------------------------------------------------------- Assessment and Plan  Frequent headaches She continues to have frequent headaches as well as presyncopal symptoms and flushing sensation.  She has seen neurology and has upcoming MRI.  Checking catecholamines, metanephrines and 5 HIAA.   No orders of the defined types were placed in this encounter.   No follow-ups on file.    This visit occurred during the SARS-CoV-2 public health emergency.  Safety protocols were in place, including screening questions prior to the visit, additional usage of staff PPE, and extensive cleaning of exam room while observing appropriate contact time as indicated for disinfecting solutions.

## 2022-10-22 LAB — CATECHOLAMINES, FRACTIONATED, PLASMA
Dopamine: <10 pg/mL
Epinephrine: <10 pg/mL
Norepinephrine: 196 pg/mL
Total Catecholamines: <216 pg/mL

## 2022-10-25 ENCOUNTER — Other Ambulatory Visit: Payer: Managed Care, Other (non HMO)

## 2022-10-25 LAB — CATECHOLAMINES, FRACTIONATED, URINE, 24 HOUR
Calc Total (E+NE): 37 mcg/24 h (ref 26–121)
Creatinine, Urine mg/day-CATEUR: 1.55 g/(24.h) (ref 0.50–2.15)
Dopamine 24 Hr Urine: 219 mcg/24 h (ref 52–480)
Norepinephrine, 24H, Ur: 37 mcg/24 h (ref 15–100)
Total Volume: 3000 mL

## 2022-10-25 LAB — METANEPHRINES, URINE, 24 HOUR
METANEPHRINE: 80 mcg/24 h (ref 25–222)
METANEPHRINES, TOTAL: 243 mcg/24 h (ref 94–604)
NORMETANEPHRINE: 163 mcg/24 h (ref 40–412)
Total Volume: 3000 mL

## 2022-10-26 ENCOUNTER — Encounter: Payer: Self-pay | Admitting: Cardiovascular Disease

## 2022-10-26 ENCOUNTER — Ambulatory Visit: Payer: Managed Care, Other (non HMO) | Attending: Cardiovascular Disease | Admitting: Cardiovascular Disease

## 2022-10-26 VITALS — BP 116/82 | HR 87 | Ht 64.0 in | Wt 256.6 lb

## 2022-10-26 DIAGNOSIS — R030 Elevated blood-pressure reading, without diagnosis of hypertension: Secondary | ICD-10-CM | POA: Diagnosis not present

## 2022-10-26 DIAGNOSIS — R55 Syncope and collapse: Secondary | ICD-10-CM

## 2022-10-26 NOTE — Patient Instructions (Signed)
Medication Instructions:  No changes *If you need a refill on your cardiac medications before your next appointment, please call your pharmacy*   Testing/Procedures: Your physician has requested that you have an echocardiogram. Echocardiography is a painless test that uses sound waves to create images of your heart. It provides your doctor with information about the size and shape of your heart and how well your heart's chambers and valves are working. This procedure takes approximately one hour. There are no restrictions for this procedure. Please do NOT wear cologne, perfume, aftershave, or lotions (deodorant is allowed). Please arrive 15 minutes prior to your appointment time.    Follow-Up: At [facility], you and your health needs are our priority.  As part of our continuing mission to provide you with exceptional heart care, we have created designated Provider Care Teams.  These Care Teams include your primary Cardiologist (physician) and Advanced Practice Providers (APPs -  Physician Assistants and Nurse Practitioners) who all work together to provide you with the care you need, when you need it.  We recommend signing up for the patient portal called "MyChart".  Sign up information is provided on this After Visit Summary.  MyChart is used to connect with patients for Virtual Visits (Telemedicine).  Patients are able to view lab/test results, encounter notes, upcoming appointments, etc.  Non-urgent messages can be sent to your provider as well.   To learn more about what you can do with MyChart, go to [email or web address].    Your next appointment:    Follow up as needed  Provider:   Dr Sallyanne Kuster

## 2022-10-27 ENCOUNTER — Other Ambulatory Visit: Payer: Managed Care, Other (non HMO)

## 2022-10-27 ENCOUNTER — Encounter: Payer: Self-pay | Admitting: Cardiovascular Disease

## 2022-10-27 NOTE — Progress Notes (Signed)
Cardiology Office Note:    Date:  10/27/2022   ID:  MYAN LAMBRIX, DOB 07/14/1996, MRN [id number]  PCP:  Luetta Nutting, [place] Providers Cardiologist:  None     Referring MD: Luetta Nutting, DO   Chief Complaint  Patient presents with   Consult  Barbara Hooper is a 27 y.o. female who is being seen today for the evaluation of near syncope and BP abnormalities at the request of Luetta Nutting, DO.   History of Present Illness:    Barbara Hooper is a 27 y.o. female with a hx of previous problems with anxiety and depression, who recently has had a couple of episodes of near syncope.  She has been having issues for the last roughly 3 months with dizziness and headaches.  She has had evaluation by her neurologist including a brain MRI without abnormalities found.  On a couple of occasions, while simply sitting at her computer she suddenly felt blood draining from her face, her face became numb, she was dizzy.  She sought medical attention and by that time her blood pressure was actually quite high at 170/105, although it rapidly normalized when she arrived at the emergency room.  A similar episode happened again a few days later.  She did not have full-blown syncope with either event.  During her evaluation in the emergency room on 10/05/2022 her labs were normal, including CBC, electrolytes and TSH.  She has had screening with serum catecholamine levels and a 24-hour urine collection for metanephrines, which are likewise normal.  When she was roughly 27 years old she had an episode of syncope when she had a bad ear infection, but none since.  She is wondering arrhythmia monitor which she just mailed back last weekend, but the tracings are not available for review yet.  Past Medical History:  Diagnosis Date   Elevated blood pressure    GAD (generalized anxiety disorder) 01/09/2016   Major depression in complete remission ([facility]) 05/11/2016    History reviewed.  No pertinent surgical history.  Current Medications: Current Meds  Medication Sig   aspirin-acetaminophen-caffeine (EXCEDRIN MIGRAINE) 250-250-65 MG tablet Take 1 tablet by mouth every 6 (six) hours as needed for headache.   etonogestrel (NEXPLANON) 68 MG IMPL implant 1 each once by Subdermal route.   fluticasone (FLONASE) 50 MCG/ACT nasal spray Place 2 sprays into both nostrils daily.     Allergies:   Patient has no known allergies.   Social History   Socioeconomic History   Marital status: Single    Spouse name: Not on file   Number of children: Not on file   Years of education: Not on file   Highest education level: Not on file  Occupational History   Not on file  Tobacco Use   Smoking status: Never   Smokeless tobacco: Current   Tobacco comments:    10/26/2022 Patient vape daily  Vaping Use   Vaping Use: Some days   Substances: Nicotine  Substance and Sexual Activity   Alcohol use: Yes    Alcohol/week: 7.0 standard drinks of alcohol    Types: 7 Glasses of wine per week    Comment: WEEKLY   Drug use: Not Currently    Types: Marijuana    Comment: OCCASIONALLY, stoppped in January   Sexual activity: Yes    Partners: Male    Birth control/protection: Implant    Comment: Nexplanon  Other Topics Concern   Not on file  Social History  Narrative   Caffiene 2 cups daily   Social Determinants of Health   Financial Resource Strain: Not on file  Food Insecurity: Food Insecurity Present (08/09/2022)   Hunger Vital Sign    Worried About Running Out of Food in the Last Year: Sometimes true    Ran Out of Food in the Last Year: Never true  Transportation Needs: Not on file  Physical Activity: Not on file  Stress: Not on file  Social Connections: Not on file     Family History: The patient's family history includes Breast cancer in her paternal aunt; Cancer in her maternal aunt and maternal uncle; Depression in her maternal grandmother; Glaucoma in her mother; Heart  attack in her paternal grandfather; Hypertension in her father and mother; Skin cancer in her mother. There is no history of Heart disease.  ROS:   Please see the history of present illness.     All other systems reviewed and are negative.  EKGs/Labs/Other Studies Reviewed:    The following studies were reviewed today: Notes from ED visit 10/05/2022, multiple labs, MRI brain  EKG:  EKG is not ordered today.  The ekg ordered 10/05/2022 demonstrates NSR, nonspecific T wave changes  Recent Labs: 10/05/2022: ALT 43; BUN 5; Creatinine, Ser 0.60; Hemoglobin 14.1; Platelets 216; Potassium 3.5; Sodium 137; TSH 4.064  Recent Lipid Panel    Component Value Date/Time   CHOL 161 09/19/2020 0000   TRIG 66 09/19/2020 0000   HDL 44 (L) 09/19/2020 0000   CHOLHDL 3.7 09/19/2020 0000   VLDL 12 02/13/2016 0936   LDLCALC 102 (H) 09/19/2020 0000     Risk Assessment/Calculations:                Physical Exam:    VS:  BP 116/82 (BP Location: Left Arm, Patient Position: Sitting, Cuff Size: Large)   Pulse 87   Ht 5\' 4"  (1.626 m)   Wt 256 lb 9.6 oz (116.4 kg)   SpO2 98%   BMI 44.05 kg/m     Wt Readings from Last 3 Encounters:  10/26/22 256 lb 9.6 oz (116.4 kg)  10/11/22 253 lb (114.8 kg)  10/05/22 257 lb (116.6 kg)     GEN: BMI morbidly obese range, well nourished, well developed in no acute distress HEENT: Normal NECK: No JVD; No carotid bruits LYMPHATICS: No lymphadenopathy CARDIAC: RRR, no murmurs, rubs, gallops RESPIRATORY:  Clear to auscultation without rales, wheezing or rhonchi  ABDOMEN: Soft, non-tender, non-distended MUSCULOSKELETAL:  No edema; No deformity  SKIN: Warm and dry NEUROLOGIC:  Alert and oriented x 3 PSYCHIATRIC:  Normal affect   ASSESSMENT:    1. Near syncope   2. Elevated blood pressure reading    PLAN:    In order of problems listed above:  Near syncope: Her symptoms sound strongly suggestive of neurally mediated (near) syncope with typical prodrome.   Suspect that the blood pressure was elevated because it was checked when she was in a rebound phase, recovering from what was likely hypotension and bradycardia.  The trigger is unclear.  Discussed the mechanism of vasovagal events.  Important to stay well-hydrated and I would advise a relatively liberal intake of sodium.  Will review the monitor that she wore once available, but it sounds like she was pretty much asymptomatic while wearing it.  It may not be very revealing.  Will schedule an echocardiogram to make sure there is no underlying structural heart disease.  Discussed personal monitoring devices such as a smart watch or  Kardiamobile to record ECG tracings during symptoms, which I can then review via MyChart. Elevated blood pressure reading: This was most likely physiological/reactive after the unpleasant near syncopal event.  She has been evaluated for pheochromocytoma with negative findings.  Normal blood pressure today.  No evidence of LVH on ECG.           Medication Adjustments/Labs and Tests Ordered: Current medicines are reviewed at length with the patient today.  Concerns regarding medicines are outlined above.  Orders Placed This Encounter  Procedures   ECHOCARDIOGRAM COMPLETE   No orders of the defined types were placed in this encounter.   Patient Instructions  Medication Instructions:  No changes *If you need a refill on your cardiac medications before your next appointment, please call your pharmacy*   Testing/Procedures: Your physician has requested that you have an echocardiogram. Echocardiography is a painless test that uses sound waves to create images of your heart. It provides your doctor with information about the size and shape of your heart and how well your heart's chambers and valves are working. This procedure takes approximately one hour. There are no restrictions for this procedure. Please do NOT wear cologne, perfume, aftershave, or lotions (deodorant  is allowed). Please arrive 15 minutes prior to your appointment time.    Follow-Up: At [facility], you and your health needs are our priority.  As part of our continuing mission to provide you with exceptional heart care, we have created designated Provider Care Teams.  These Care Teams include your primary Cardiologist (physician) and Advanced Practice Providers (APPs -  Physician Assistants and Nurse Practitioners) who all work together to provide you with the care you need, when you need it.  We recommend signing up for the patient portal called "MyChart".  Sign up information is provided on this After Visit Summary.  MyChart is used to connect with patients for Virtual Visits (Telemedicine).  Patients are able to view lab/test results, encounter notes, upcoming appointments, etc.  Non-urgent messages can be sent to your provider as well.   To learn more about what you can do with MyChart, go to [email or web address].    Your next appointment:    Follow up as needed  Provider:   Dr Sallyanne Kuster    Signed, Sanda Klein, MD  10/27/2022 11:11 AM    [place]

## 2022-10-29 ENCOUNTER — Telehealth: Payer: Self-pay | Admitting: Emergency Medicine

## 2022-10-29 NOTE — Telephone Encounter (Signed)
Called to follow up on who ordered the monitor that she stated she recently wore. It was not ordered by one of our providers. Left message: She will need to contact the ordering provider and provide them with a release to forward results to Dr Sallyanne Kuster.   Will send a MyChart message as well

## 2022-11-08 ENCOUNTER — Ambulatory Visit: Payer: Managed Care, Other (non HMO) | Admitting: Dietician

## 2022-11-26 ENCOUNTER — Ambulatory Visit (HOSPITAL_COMMUNITY): Payer: Managed Care, Other (non HMO) | Attending: Cardiovascular Disease

## 2022-11-26 DIAGNOSIS — R55 Syncope and collapse: Secondary | ICD-10-CM | POA: Insufficient documentation

## 2022-11-26 LAB — ECHOCARDIOGRAM COMPLETE
Area-P 1/2: 2.8 cm2
S' Lateral: 3 cm

## 2022-11-27 ENCOUNTER — Encounter: Payer: Self-pay | Admitting: Emergency Medicine

## 2022-12-05 ENCOUNTER — Encounter: Payer: Self-pay | Admitting: Family Medicine

## 2022-12-05 MED ORDER — ESCITALOPRAM OXALATE 10 MG PO TABS: ORAL_TABLET | ORAL | 1 refills | Status: DC

## 2023-03-25 ENCOUNTER — Encounter: Payer: Self-pay | Admitting: Family Medicine

## 2023-04-04 ENCOUNTER — Encounter: Payer: Self-pay | Admitting: Neurology

## 2023-04-04 ENCOUNTER — Ambulatory Visit: Payer: Managed Care, Other (non HMO) | Admitting: Neurology

## 2023-05-07 ENCOUNTER — Encounter: Payer: Self-pay | Admitting: Family Medicine

## 2023-05-07 ENCOUNTER — Ambulatory Visit: Payer: Managed Care, Other (non HMO) | Admitting: Family Medicine

## 2023-05-07 VITALS — Ht 64.0 in | Wt 256.6 lb

## 2023-05-07 DIAGNOSIS — S40862A Insect bite (nonvenomous) of left upper arm, initial encounter: Secondary | ICD-10-CM | POA: Diagnosis not present

## 2023-05-07 DIAGNOSIS — T63441A Toxic effect of venom of bees, accidental (unintentional), initial encounter: Secondary | ICD-10-CM | POA: Diagnosis not present

## 2023-05-07 DIAGNOSIS — T63461A Toxic effect of venom of wasps, accidental (unintentional), initial encounter: Secondary | ICD-10-CM | POA: Diagnosis not present

## 2023-05-07 DIAGNOSIS — T63451A Toxic effect of venom of hornets, accidental (unintentional), initial encounter: Secondary | ICD-10-CM

## 2023-05-07 MED ORDER — TRIAMCINOLONE ACETONIDE 40 MG/ML IJ SUSP
40.0000 mg | Freq: Once | INTRAMUSCULAR | Status: AC
  Administered 2023-05-07: 40 mg via INTRAMUSCULAR

## 2023-05-07 MED ORDER — EPINEPHRINE 0.3 MG/0.3ML IJ SOAJ: 0.3000 mg | INTRAMUSCULAR | 0 refills | Status: AC | PRN

## 2023-05-07 NOTE — Progress Notes (Signed)
Barbara Hooper - 27 y.o. female MRN [id number]  Date of birth: February 13, 1996  Subjective Chief Complaint  Patient presents with   Insect Bite    HPI Barbara Hooper is a 27 y.o. female here today with complaint of bee sting.  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  She was stung by a Yellow Jacket.  Had some swelling initially which has persisted, and somewhat worsened over the past couple of days.  She has quite a bit of itching with some burning.  The site is warm to touch.  She denies fever or chills.  She has been using benadryl cream on the area and has taken benadryl.  She has never had a reaction like this to bee or wasp sting.    ROS:  A comprehensive ROS was completed and negative except as noted per HPI  Allergies  Allergen Reactions   Yellow Jacket Venom Hives    Past Medical History:  Diagnosis Date   Elevated blood pressure    GAD (generalized anxiety disorder) 01/09/2016   Major depression in complete remission (H[facility]) 05/11/2016    History reviewed. No pertinent surgical history.  Social History   Socioeconomic History   Marital status: Single    Spouse name: Not on file   Number of children: Not on file   Years of education: Not on file   Highest education level: Not on file  Occupational History   Not on file  Tobacco Use   Smoking status: Never   Smokeless tobacco: Current   Tobacco comments:    10/26/2022 Patient vape daily  Vaping Use   Vaping status: Some Days   Substances: Nicotine  Substance and Sexual Activity   Alcohol use: Yes    Alcohol/week: 7.0 standard drinks of alcohol    Types: 7 Glasses of wine per week    Comment: WEEKLY   Drug use: Not Currently    Types: Marijuana    Comment: OCCASIONALLY, stoppped in January   Sexual activity: Yes    Partners: Male    Birth control/protection: Implant    Comment: Nexplanon  Other Topics Concern   Not on file  Social History Narrative   Caffiene 2 cups daily    Social Determinants of Health   Financial Resource Strain: Not on file  Food Insecurity: Food Insecurity Present (08/09/2022)   Hunger Vital Sign    Worried About Running Out of Food in the Last Year: Sometimes true    Ran Out of Food in the Last Year: Never true  Transportation Needs: Not on file  Physical Activity: Not on file  Stress: Not on file  Social Connections: Unknown (10/05/2022)   Received from [facility]   Social Network    Social Network: Not on file    Family History  Problem Relation Age of Onset   Hypertension Mother    Skin cancer Mother    Glaucoma Mother    Hypertension Father    Cancer Maternal Aunt    Cancer Maternal Uncle    Breast cancer Paternal Aunt    Depression Maternal Grandmother    Heart attack Paternal Grandfather    Heart disease Neg Hx     Health Maintenance  Topic Date Due   INFLUENZA VACCINE  02/28/2023   Cervical Cancer Screening (Pap smear)  09/14/2023   DTaP/Tdap/Td (8 - Td or Tdap) 06/05/2027   HPV VACCINES  Completed   COVID-19 Vaccine  Completed  Hepatitis C Screening  Completed   HIV Screening  Completed     ----------------------------------------------------------------------------------------------------------------------------------------------------------------------------------------------------------------- Physical Exam There were no vitals taken for this visit.  Physical Exam Constitutional:      Appearance: Normal appearance.  Skin:    Comments: ~ 4cm x 6cm  raised hive on L forearm with erythema and warmth  Neurological:     Mental Status: She is alert.     ------------------------------------------------------------------------------------------------------------------------------------------------------------------------------------------------------------------- Assessment and Plan  Sting from hornet, wasp, or bee, accidental or unintentional, initial encounter Does not appear to  be infected at this time.  Given injection of Kenalog 40 mg.  Recommend use of topical hydrocortisone cream.  Use Benadryl or cetirizine as needed.  Instructed to come to clinic if having worsening symptoms.  Given progressive reactions to bee stings will add on EpiPen as needed as well.    Meds ordered this encounter  Medications   EPINEPHrine (EPIPEN 2-PAK) 0.3 mg/0.3 mL IJ SOAJ injection    Sig: Inject 0.3 mg into the muscle as needed for anaphylaxis.    Dispense:  1 each    Refill:  0   triamcinolone acetonide (KENALOG-40) injection 40 mg    No follow-ups on file.    This visit occurred during the SARS-CoV-2 public health emergency.  Safety protocols were in place, including screening questions prior to the visit, additional usage of staff PPE, and extensive cleaning of exam room while observing appropriate contact time as indicated for disinfecting solutions.

## 2023-05-07 NOTE — Assessment & Plan Note (Signed)
Does not appear to be infected at this time.  Given injection of Kenalog 40 mg.  Recommend use of topical hydrocortisone cream.  Use Benadryl or cetirizine as needed.  Instructed to come to clinic if having worsening symptoms.  Given progressive reactions to bee stings will add on EpiPen as needed as well.

## 2023-05-07 NOTE — Patient Instructions (Addendum)
Try over the counter hydrocortisone cream. Continue benadryl or cetirizine   How to Use an Auto-Injector Pen An auto-injector pen is a device filled with medicine that gives a shot of epinephrine. It relaxes the muscles in the airways and tightens the blood vessels. It is used to treat a severe allergic reaction (anaphylactic reaction). Using an auto-injector pen can save your life. You should always carry one with you if you are at risk for an anaphylactic reaction. Other names for an auto-injector pen include an epinephrine injection, an epinephrine pen, and an automatic injection device. What are the risks? It is safe to use an auto-injector pen. Watch for problems, such as damage to bone or tissue. Make sure that you place the needle in the right spot when you give the shot. It should be in the muscle of your outer thigh as told by your health care provider. When should I use my auto-injector pen? Use your auto-injector pen as soon as you think you are having an anaphylactic reaction. Signs may include: Feeling warm in the face (flushed). Your face may turn red. Itchy, red, swollen areas of skin (hives). Swelling of the eyes, lips, face, mouth, tongue, or throat. Trouble breathing, speaking, or swallowing. Noisy breathing (wheezing). Feeling dizzy or light-headed. Fainting. Pain or cramping in the abdomen. Vomiting or diarrhea. These symptoms may be an emergency. Get help right away. Call 911. Use the auto-injector pen as told by your provider. Do not wait to see if the symptoms will go away. Do not drive yourself to the hospital. How to use the auto-injector pen  Sit or lie down before you take or are given the shot. Use the auto-injector pen to give the shot under your skin or into your muscle on the outer side of your thigh. Do not inject it into your butt or any other part of your body. If you need to, you can use the pen through your clothing. After you give yourself the shot,  there may still be some liquid in the pen. This is normal. Replace the liquid medicine. If possible, carry two auto-injector pens. If you need a second dose, put the shot somewhere else on your outer thigh. Do not put two shots into the same spot. This can lead to tissue damage. General tips Use the auto-injector pen as told by your provider. Do not inject it more often than your provider tells you to. Do not add in extra medicine or take less than you should. Most pens contain one dose of epinephrine. Some have two doses. From time to time: Check the expiration date on your auto-injector pen. Check the solution in the pen. Make sure it is not cloudy and there are no particles floating in it. If the pen has expired or the solution does not look like it should, throw the pen away and get a new one. Ask your provider how to get rid of used or expired auto-injector pens. Talk with your provider or a pharmacist if you are not sure about how to give yourself the shot. Get a prescription for another auto-injector pen. Fill the prescription as soon as you can. Get help right away if: You use the auto-injector pen. You must still get medical help, even if the medicine seems to be working. You have side effects from the epinephrine. These may include: Fast or irregular heartbeat. Nervousness or anxiety with shaking that does not stop. Trouble breathing. Sweating. Headache. Nausea or vomiting. Dizziness or weakness. These symptoms  may be an emergency. Get help right away. Call 911. Do not wait to see if the symptoms will go away. Do not drive yourself to the hospital. This information is not intended to replace advice given to you by your health care provider. Make sure you discuss any questions you have with your health care provider. Document Revised: 03/08/2022 Document Reviewed: 03/08/2022 Elsevier Patient Education  2024 [employer].

## 2023-08-13 ENCOUNTER — Telehealth: Payer: Self-pay | Admitting: *Deleted

## 2023-08-13 NOTE — Telephone Encounter (Signed)
 Returned call from 1:23 PM. Left patient a message to call and schedule 2 different appointments for annual and pap and 1 for Nexplanon removal.

## 2023-09-17 ENCOUNTER — Other Ambulatory Visit: Payer: Self-pay | Admitting: Family Medicine

## 2023-09-17 NOTE — Telephone Encounter (Signed)
 Pls contact pt to schedule appt for Lexapro refill. Thx

## 2023-09-19 ENCOUNTER — Ambulatory Visit: Payer: Managed Care, Other (non HMO) | Admitting: Family Medicine

## 2023-09-19 ENCOUNTER — Encounter: Payer: Self-pay | Admitting: Family Medicine

## 2023-09-19 VITALS — BP 134/82 | HR 75 | Ht 64.0 in | Wt 266.0 lb

## 2023-09-19 DIAGNOSIS — F411 Generalized anxiety disorder: Secondary | ICD-10-CM

## 2023-09-19 DIAGNOSIS — Z975 Presence of (intrauterine) contraceptive device: Secondary | ICD-10-CM

## 2023-09-19 DIAGNOSIS — Z124 Encounter for screening for malignant neoplasm of cervix: Secondary | ICD-10-CM

## 2023-09-19 DIAGNOSIS — Z3046 Encounter for surveillance of implantable subdermal contraceptive: Secondary | ICD-10-CM

## 2023-09-19 MED ORDER — ESCITALOPRAM OXALATE 10 MG PO TABS: ORAL_TABLET | ORAL | 1 refills | Status: DC

## 2023-09-19 NOTE — Assessment & Plan Note (Signed)
 Referral to GYN for updated cervical cancer screening and management of Nexplanon.

## 2023-09-19 NOTE — Progress Notes (Signed)
 Barbara Hooper - 28 y.o. female MRN [id number]  Date of birth: 02-Nov-1995  Subjective Chief Complaint  Patient presents with   Medical Management of Chronic Issues    HPI Barbara Hooper is a 28 y.o. female here today for follow up visit.   She continues on lexapro for management of anxiety.  She reports that she is still doing pretty well with this.  She denies significant side effects at this time with current strength.  She would like to continue this at current strength.    She would like a referral to GYN for management of her Nexplanon and Pap smears.  ROS:  A comprehensive ROS was completed and negative except as noted per HPI  Allergies  Allergen Reactions   Yellow Jacket Venom Hives    Past Medical History:  Diagnosis Date   Elevated blood pressure    GAD (generalized anxiety disorder) 01/09/2016   Major depression in complete remission (HCC) 05/11/2016    History reviewed. No pertinent surgical history.  Social History   Socioeconomic History   Marital status: Single    Spouse name: Not on file   Number of children: Not on file   Years of education: Not on file   Highest education level: Not on file  Occupational History   Not on file  Tobacco Use   Smoking status: Never   Smokeless tobacco: Current   Tobacco comments:    10/26/2022 Patient vape daily  Vaping Use   Vaping status: Some Days   Substances: Nicotine  Substance and Sexual Activity   Alcohol use: Yes    Alcohol/week: 7.0 standard drinks of alcohol    Types: 7 Glasses of wine per week    Comment: WEEKLY   Drug use: Not Currently    Types: Marijuana    Comment: OCCASIONALLY, stoppped in January   Sexual activity: Yes    Partners: Male    Birth control/protection: Implant    Comment: Nexplanon  Other Topics Concern   Not on file  Social History Narrative   Caffiene 2 cups daily   Social Drivers of Health   Financial Resource Strain: Not on file  Food Insecurity: Food  Insecurity Present (08/09/2022)   Hunger Vital Sign    Worried About Running Out of Food in the Last Year: Sometimes true    Ran Out of Food in the Last Year: Never true  Transportation Needs: Not on file  Physical Activity: Not on file  Stress: Not on file  Social Connections: Unknown (10/05/2022)   Received [facility]   Social Network    Social Network: Not on file    Family History  Problem Relation Age of Onset   Hypertension Mother    Skin cancer Mother    Glaucoma Mother    Hypertension Father    Cancer Maternal Aunt    Cancer Maternal Uncle    Breast cancer Paternal Aunt    Depression Maternal Grandmother    Heart attack Paternal Grandfather    Heart disease Neg Hx     Health Maintenance  Topic Date Due   Cervical Cancer Screening (Pap smear)  09/14/2023   INFLUENZA VACCINE  10/28/2023 (Originally 02/28/2023)   COVID-19 Vaccine (4 - 2024-25 season) 10/04/2024 (Originally 03/31/2023)   DTaP/Tdap/Td (8 - Td or Tdap) 06/05/2027   HPV VACCINES  Completed   Hepatitis C Screening  Completed   HIV Screening  Completed     ----------------------------------------------------------------------------------------------------------------------------------------------------------------------------------------------------------------- Physical Exam BP 134/82 (BP  Location: Left Arm, Patient Position: Sitting, Cuff Size: Large)   Pulse 75   Ht 5\' 4"  (1.626 m)   Wt 266 lb (120.7 kg)   SpO2 98%   BMI 45.66 kg/m   Physical Exam Constitutional:      Appearance: Normal appearance.  HENT:     Head: Normocephalic and atraumatic.  Eyes:     General: No scleral icterus. Neurological:     General: No focal deficit present.     Mental Status: She is alert.  Psychiatric:        Mood and Affect: Mood normal.        Behavior: Behavior normal.      ------------------------------------------------------------------------------------------------------------------------------------------------------------------------------------------------------------------- Assessment and Plan  Nexplanon in place Referral to GYN for updated cervical cancer screening and management of Nexplanon.  GAD (generalized anxiety disorder) Doing well with Lexapro at current strength.  Will plan to continue.   Meds ordered this encounter  Medications   escitalopram (LEXAPRO) 10 MG tablet    Sig: Take 1 tab po daily x7 days.    Dispense:  90 tablet    Refill:  1    Return in about 6 months (around 03/18/2024) for Mood/BH.    This visit occurred during the SARS-CoV-2 public health emergency.  Safety protocols were in place, including screening questions prior to the visit, additional usage of staff PPE, and extensive cleaning of exam room while observing appropriate contact time as indicated for disinfecting solutions.

## 2023-09-19 NOTE — Assessment & Plan Note (Signed)
 Doing well with Lexapro at current strength.  Will plan to continue.

## 2023-11-03 ENCOUNTER — Telehealth: Admitting: Family

## 2023-11-03 DIAGNOSIS — H60332 Swimmer's ear, left ear: Secondary | ICD-10-CM | POA: Diagnosis not present

## 2023-11-03 MED ORDER — CIPRO HC 0.2-1 % OT SUSP: 3.0000 [drp] | Freq: Two times a day (BID) | OTIC | 0 refills | Status: DC

## 2023-11-03 MED ORDER — AMOXICILLIN-POT CLAVULANATE 875-125 MG PO TABS: 1.0000 | ORAL_TABLET | Freq: Two times a day (BID) | ORAL | 0 refills | Status: DC

## 2023-11-03 NOTE — Progress Notes (Signed)
 E Visit for Ear Pain - Swimmer's Ear  We are sorry that you are not feeling well. Here is how we plan to help!  Based on what you have shared with me it looks like you have Swimmer's Ear.  Swimmer's ear is a redness or swelling, irritation, or infection of your outer ear canal. These symptoms usually occur within a few days of swimming. Your ear canal is a tube that goes from the opening of the ear to the eardrum.  When water stays in your ear canal, germs can grow.  This is a painful condition that often happens to children and swimmers of all ages.  It is not contagious and oral antibiotics are not required to treat uncomplicated swimmer's ear.  The usual symptoms include:    Itchiness inside the ear  Redness or a sense of swelling in the ear  Pain when the ear is tugged on when pressure is placed on the ear  Pus draining from the infected ear   I have prescribed Augmentin 875-125 mg one tablet by mouth twice a day for 7 days  I have prescribed: Ciprofloxin 0.2% and hydrocortisone 1% otic suspension 3 drops in affected ears twice daily for 7 days  In certain cases, swimmer's ear may progress to a more serious bacterial infection of the middle or inner ear.  If you have a fever 102 and up and significantly worsening symptoms, this could indicate a more serious infection moving to the middle/inner and needs face to face evaluation in an office by a provider.  Your symptoms should improve over the next 3 days and should resolve in about 7 days.  Be sure to complete ALL of your prescription.  HOME CARE: Wash your hands frequently. If you are prescribed an ear drop, do not place the tip of the bottle on your ear or touch it with your fingers. You can take Acetaminophen 650 mg every 4-6 hours as needed for pain.  If pain is severe or moderate, you can apply a heating pad (set on low) or hot water bottle (wrapped in a towel) to outer ear for 20 minutes.  This will also increase drainage. Avoid ear  plugs Do not go swimming until the symptoms are gone Do not use Q-tips After showers, help the water run out by tilting your head to one side.   GET HELP RIGHT AWAY IF: Fever is over 102.2 degrees. You develop progressive ear pain or hearing loss. Ear symptoms persist longer than 3 days after treatment.  MAKE SURE YOU: Understand these instructions. Will watch your condition. Will get help right away if you are not doing well or get worse.  TO PREVENT SWIMMER'S EAR: Use a bathing cap or custom fitted swim molds to keep your ears dry. Towel off after swimming to dry your ears. Tilt your head or pull your earlobes to allow the water to escape your ear canal. If there is still water in your ears, consider using a hairdryer on the lowest setting.  Thank you for choosing an e-visit.  Your e-visit answers were reviewed by a board certified advanced clinical practitioner to complete your personal care plan. Depending upon the condition, your plan could have included both over the counter or prescription medications.  Please review your pharmacy choice. Make sure the pharmacy is open so you can pick up the prescription now. If there is a problem, you may contact your provider through [employer] and have the prescription routed to another pharmacy.  Your safety is important to [place]. If you have drug allergies check your prescription carefully.   For the next 24 hours you can use MyChart to ask questions about today's visit, request a non-urgent call back, or ask for a work or school excuse. You will get an email with a survey after your eVisit asking about your experience. We would appreciate your feedback. I hope that your e-visit has been valuable and will aid in your recovery.   Approximately 5 minutes was spent documenting and reviewing patient's chart.

## 2024-03-17 ENCOUNTER — Other Ambulatory Visit: Payer: Self-pay | Admitting: Family Medicine

## 2024-03-17 NOTE — Telephone Encounter (Signed)
 Hold for 03/19/24 Appt

## 2024-03-19 ENCOUNTER — Encounter: Payer: Self-pay | Admitting: Family Medicine

## 2024-03-19 ENCOUNTER — Ambulatory Visit (INDEPENDENT_AMBULATORY_CARE_PROVIDER_SITE_OTHER): Payer: Managed Care, Other (non HMO) | Admitting: Family Medicine

## 2024-03-19 VITALS — BP 123/81 | HR 62 | Ht 64.0 in | Wt 265.0 lb

## 2024-03-19 DIAGNOSIS — F411 Generalized anxiety disorder: Secondary | ICD-10-CM

## 2024-03-19 MED ORDER — VORTIOXETINE HBR 10 MG PO TABS: 10.0000 mg | ORAL_TABLET | Freq: Every day | ORAL | 3 refills | Status: DC

## 2024-03-19 NOTE — Assessment & Plan Note (Signed)
 Discontinue lexapro  and change to trintellix .  Will plan to follow up in 3 months.

## 2024-03-19 NOTE — Progress Notes (Signed)
 Barbara Hooper - 28 y.o. female MRN [id number]  Date of birth: 08/18/1995  Subjective Chief Complaint  Patient presents with   Mood    HPI Barbara Hooper is a 28 y.o. female here today for follow up visit.   She reports that she is doing ok.   She remains on lexapro  for management of MDD and anxiety.  She feels that this is working well for her anxiety but she feels flat emotionally.  Doesn't feel sad but doesn't really feel happy, just somewhere in the middle.  She has not had any other side effects from this.   ROS:  A comprehensive ROS was completed and negative except as noted per HPI  Allergies  Allergen Reactions   Yellow Jacket Venom Hives    Past Medical History:  Diagnosis Date   Elevated blood pressure    GAD (generalized anxiety disorder) 01/09/2016   Major depression in complete remission (HCC) 05/11/2016    History reviewed. No pertinent surgical history.  Social History   Socioeconomic History   Marital status: Single    Spouse name: Not on file   Number of children: Not on file   Years of education: Not on file   Highest education level: Not on file  Occupational History   Not on file  Tobacco Use   Smoking status: Never   Smokeless tobacco: Current   Tobacco comments:    10/26/2022 Patient vape daily  Vaping Use   Vaping status: Some Days   Substances: Nicotine  Substance and Sexual Activity   Alcohol use: Yes    Alcohol/week: 7.0 standard drinks of alcohol    Types: 7 Glasses of wine per week    Comment: WEEKLY   Drug use: Not Currently    Types: Marijuana    Comment: OCCASIONALLY, stoppped in January   Sexual activity: Yes    Partners: Male    Birth control/protection: Implant    Comment: Nexplanon   Other Topics Concern   Not on file  Social History Narrative   Caffiene 2 cups daily   Social Drivers of Health   Financial Resource Strain: Not on file  Food Insecurity: Food Insecurity Present (08/09/2022)   Hunger Vital Sign     Worried About Running Out of Food in the Last Year: Sometimes true    Ran Out of Food in the Last Year: Never true  Transportation Needs: Not on file  Physical Activity: Not on file  Stress: Not on file  Social Connections: Unknown (10/05/2022)   [facility]   Social Network    Social Network: Not on file    Family History  Problem Relation Age of Onset   Hypertension Mother    Skin cancer Mother    Glaucoma Mother    Hypertension Father    Cancer Maternal Aunt    Cancer Maternal Uncle    Breast cancer Paternal Aunt    Depression Maternal Grandmother    Heart attack Paternal Grandfather    Heart disease Neg Hx     Health Maintenance  Topic Date Due   Hepatitis B Vaccines 19-59 Average Risk (3 of 3 - 3-dose series) 05/27/1996   Cervical Cancer Screening (Pap smear)  09/14/2023   INFLUENZA VACCINE  02/28/2024   COVID-19 Vaccine (4 - 2024-25 season) 10/04/2024 (Originally 03/31/2023)   DTaP/Tdap/Td (8 - Td or Tdap) 06/05/2027   HPV VACCINES  Completed   Hepatitis C Screening  Completed   HIV Screening  Completed  Pneumococcal Vaccine  Aged Out   Meningococcal B Vaccine  Aged Out     ----------------------------------------------------------------------------------------------------------------------------------------------------------------------------------------------------------------- Physical Exam BP 123/81 (BP Location: Left Arm, Patient Position: Sitting, Cuff Size: Large)   Pulse 62   Ht 5' 4 (1.626 m)   Wt 265 lb (120.2 kg)   SpO2 97%   BMI 45.49 kg/m   Physical Exam Constitutional:      Appearance: Normal appearance.  Eyes:     General: No scleral icterus. Cardiovascular:     Rate and Rhythm: Normal rate and regular rhythm.  Pulmonary:     Effort: Pulmonary effort is normal.     Breath sounds: Normal breath sounds.  Neurological:     Mental Status: She is alert.  Psychiatric:        Mood and Affect: Mood normal.        Behavior:  Behavior normal.     ------------------------------------------------------------------------------------------------------------------------------------------------------------------------------------------------------------------- Assessment and Plan  GAD (generalized anxiety disorder) Discontinue lexapro  and change to trintellix .  Will plan to follow up in 3 months.    Meds ordered this encounter  Medications   vortioxetine  HBr (TRINTELLIX ) 10 MG TABS tablet    Sig: Take 1 tablet (10 mg total) by mouth daily.    Dispense:  30 tablet    Refill:  3    Return in about 3 months (around 06/19/2024) for Mood/BH.

## 2024-03-19 NOTE — Patient Instructions (Signed)
 [email or web address]

## 2024-04-22 ENCOUNTER — Telehealth: Payer: Self-pay

## 2024-04-22 ENCOUNTER — Other Ambulatory Visit (HOSPITAL_COMMUNITY): Payer: Self-pay

## 2024-04-22 ENCOUNTER — Encounter: Payer: Self-pay | Admitting: Family Medicine

## 2024-04-22 NOTE — Telephone Encounter (Signed)
 Pharmacy Patient Advocate Encounter   Received notification from Pt Calls Messages that prior authorization for Trintellix  10mg  tabs is required/requested.   Insurance verification completed.   The patient is insured through [facility] .   Per test claim: PA required; PA submitted to above mentioned insurance via Latent Key/confirmation #/EOC [id number] Status is pending

## 2024-04-22 NOTE — Telephone Encounter (Signed)
 Pharmacy Patient Advocate Encounter  Received notification from [facility] that Prior Authorization for Trintellix  10mg  tabs has been APPROVED from 04/22/24 to 04/22/25   PA #/Case ID/Reference #: [id number]  Left a message at [facility] to notify of the approval.

## 2024-06-19 ENCOUNTER — Ambulatory Visit: Payer: Self-pay | Admitting: Family Medicine

## 2024-06-19 VITALS — BP 137/80 | HR 65 | Ht 64.0 in | Wt 255.0 lb

## 2024-06-19 DIAGNOSIS — F411 Generalized anxiety disorder: Secondary | ICD-10-CM

## 2024-06-19 MED ORDER — VORTIOXETINE HBR 10 MG PO TABS: 10.0000 mg | ORAL_TABLET | Freq: Every day | ORAL | 3 refills | Status: AC

## 2024-06-19 NOTE — Assessment & Plan Note (Addendum)
 Changed to trintellix  at previous visit.  Overall she feels that she is doing well with this.  Will plan to continue at current strength.

## 2024-06-19 NOTE — Progress Notes (Signed)
 Barbara Hooper - 28 y.o. female MRN [id number]  Date of birth: 1996/03/25  Subjective Chief Complaint  Patient presents with   Anxiety    HPI Barbara Hooper is 28 y.o. female here today for follow up visit.    She reports that she is doing pretty well.   She remains on trintellix .  She feels that she is still doing pretty well with this.  Some emotional lability recently related to her menstrual cycle.  No significant side effects related to medication.  She is not seeing a therapist at this time, but would like to restart this once finances are a little better.   ROS:  A comprehensive ROS was completed and negative except as noted per HPI    Allergies  Allergen Reactions   Yellow Jacket Venom Hives    Past Medical History:  Diagnosis Date   Elevated blood pressure    GAD (generalized anxiety disorder) 01/09/2016   Major depression in complete remission 05/11/2016    No past surgical history on file.  Social History   Socioeconomic History   Marital status: Single    Spouse name: Not on file   Number of children: Not on file   Years of education: Not on file   Highest education level: Associate degree: academic program  Occupational History   Not on file  Tobacco Use   Smoking status: Never   Smokeless tobacco: Current   Tobacco comments:    10/26/2022 Patient vape daily  Vaping Use   Vaping status: Some Days   Substances: Nicotine  Substance and Sexual Activity   Alcohol use: Yes    Alcohol/week: 7.0 standard drinks of alcohol    Types: 7 Glasses of wine per week    Comment: WEEKLY   Drug use: Not Currently    Types: Marijuana    Comment: OCCASIONALLY, stoppped in January   Sexual activity: Yes    Partners: Male    Birth control/protection: Implant    Comment: Nexplanon   Other Topics Concern   Not on file  Social History Narrative   Caffiene 2 cups daily   Social Drivers of Health   Financial Resource Strain: Medium Risk (06/19/2024)    Overall Financial Resource Strain (CARDIA)    Difficulty of Paying Living Expenses: Somewhat hard  Food Insecurity: No Food Insecurity (06/19/2024)   Hunger Vital Sign    Worried About Running Out of Food in the Last Year: Never true    Ran Out of Food in the Last Year: Never true  Transportation Needs: No Transportation Needs (06/19/2024)   PRAPARE - Administrator, Civil Service (Medical): No    Lack of Transportation (Non-Medical): No  Physical Activity: Insufficiently Active (06/19/2024)   Exercise Vital Sign    Days of Exercise per Week: 2 days    Minutes of Exercise per Session: 60 min  Stress: Stress Concern Present (06/19/2024)   [employer] of Occupational Health - Occupational Stress Questionnaire    Feeling of Stress: To some extent  Social Connections: Socially Integrated (06/19/2024)   Social Connection and Isolation Panel    Frequency of Communication with Friends and Family: More than three times a week    Frequency of Social Gatherings with Friends and Family: Once a week    Attends Religious Services: 1 to 4 times per year    Active Member of [employer] or Organizations: Yes    Attends Banker Meetings: More than 4 times per year  Marital Status: Living with partner    Family History  Problem Relation Age of Onset   Hypertension Mother    Skin cancer Mother    Glaucoma Mother    Hypertension Father    Cancer Maternal Aunt    Cancer Maternal Uncle    Breast cancer Paternal Aunt    Depression Maternal Grandmother    Heart attack Paternal Grandfather    Heart disease Neg Hx     Health Maintenance  Topic Date Due   Cervical Cancer Screening (Pap smear)  09/14/2023   Influenza Vaccine  10/27/2024 (Originally 02/28/2024)   Hepatitis B Vaccines 19-59 Average Risk (3 of 3 - 3-dose series) 06/19/2025 (Originally 05/27/1996)   COVID-19 Vaccine (4 - 2025-26 season) 07/05/2025 (Originally 03/30/2024)   DTaP/Tdap/Td (8 - Td or Tdap) 06/05/2027    HPV VACCINES  Completed   Hepatitis C Screening  Completed   HIV Screening  Completed   Pneumococcal Vaccine  Aged Out   Meningococcal B Vaccine  Aged Out     ----------------------------------------------------------------------------------------------------------------------------------------------------------------------------------------------------------------- Physical Exam BP 137/80 (BP Location: Left Arm, Patient Position: Sitting, Cuff Size: Large)   Pulse 65   Ht 5' 4 (1.626 m)   Wt 255 lb (115.7 kg)   SpO2 97%   BMI 43.77 kg/m   Physical Exam Constitutional:      Appearance: Normal appearance.  HENT:     Head: Normocephalic and atraumatic.  Cardiovascular:     Rate and Rhythm: Normal rate and regular rhythm.  Pulmonary:     Effort: Pulmonary effort is normal.     Breath sounds: Normal breath sounds.  Neurological:     Mental Status: She is alert.  Psychiatric:        Mood and Affect: Mood normal.        Behavior: Behavior normal.     ------------------------------------------------------------------------------------------------------------------------------------------------------------------------------------------------------------------- Assessment and Plan  GAD (generalized anxiety disorder) Changed to trintellix  at previous visit.  Overall she feels that she is doing well with this.  Will plan to continue at current strength.     Meds ordered this encounter  Medications   vortioxetine  HBr (TRINTELLIX ) 10 MG TABS tablet    Sig: Take 1 tablet (10 mg total) by mouth daily.    Dispense:  30 tablet    Refill:  3    Return in about 4 months (around 10/17/2024) for Mood/BH.

## 2024-10-19 ENCOUNTER — Ambulatory Visit: Admitting: Family Medicine
